# Patient Record
Sex: Female | Born: 1985 | Race: Black or African American | Hispanic: No | Marital: Single | Smoking: Current every day smoker
Health system: Southern US, Community
[De-identification: ages and names within clinical notes are randomized; demographics above are authoritative.]

## PROBLEM LIST (undated history)

## (undated) ENCOUNTER — Inpatient Hospital Stay (HOSPITAL_COMMUNITY): Payer: Self-pay

## (undated) DIAGNOSIS — K219 Gastro-esophageal reflux disease without esophagitis: Secondary | ICD-10-CM

## (undated) DIAGNOSIS — B019 Varicella without complication: Secondary | ICD-10-CM

## (undated) DIAGNOSIS — J45909 Unspecified asthma, uncomplicated: Secondary | ICD-10-CM

## (undated) DIAGNOSIS — F419 Anxiety disorder, unspecified: Secondary | ICD-10-CM

## (undated) DIAGNOSIS — L509 Urticaria, unspecified: Secondary | ICD-10-CM

## (undated) DIAGNOSIS — W3301XA Accidental discharge of shotgun, initial encounter: Secondary | ICD-10-CM

## (undated) DIAGNOSIS — G43909 Migraine, unspecified, not intractable, without status migrainosus: Secondary | ICD-10-CM

## (undated) DIAGNOSIS — L309 Dermatitis, unspecified: Secondary | ICD-10-CM

## (undated) DIAGNOSIS — R87619 Unspecified abnormal cytological findings in specimens from cervix uteri: Secondary | ICD-10-CM

## (undated) HISTORY — DX: Varicella without complication: B01.9

## (undated) HISTORY — DX: Urticaria, unspecified: L50.9

## (undated) HISTORY — DX: Dermatitis, unspecified: L30.9

## (undated) HISTORY — PX: COLPOSCOPY: SHX161

## (undated) HISTORY — DX: Unspecified abnormal cytological findings in specimens from cervix uteri: R87.619

## (undated) HISTORY — DX: Unspecified asthma, uncomplicated: J45.909

## (undated) HISTORY — DX: Migraine, unspecified, not intractable, without status migrainosus: G43.909

---
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
## 2013-06-11 ENCOUNTER — Encounter (HOSPITAL_COMMUNITY): Payer: Self-pay | Admitting: *Deleted

## 2013-06-11 ENCOUNTER — Inpatient Hospital Stay (HOSPITAL_COMMUNITY)
Admission: AD | Admit: 2013-06-11 | Discharge: 2013-06-11 | Disposition: A | Discharge status: Home / Self Care | Payer: 59 | Source: Ambulatory Visit | Attending: Obstetrics & Gynecology | Admitting: Obstetrics & Gynecology

## 2013-06-11 ENCOUNTER — Inpatient Hospital Stay (HOSPITAL_COMMUNITY): Payer: 59

## 2013-06-11 DIAGNOSIS — O2390 Unspecified genitourinary tract infection in pregnancy, unspecified trimester: Secondary | ICD-10-CM | POA: Insufficient documentation

## 2013-06-11 DIAGNOSIS — N760 Acute vaginitis: Secondary | ICD-10-CM | POA: Insufficient documentation

## 2013-06-11 DIAGNOSIS — A499 Bacterial infection, unspecified: Secondary | ICD-10-CM | POA: Insufficient documentation

## 2013-06-11 DIAGNOSIS — K219 Gastro-esophageal reflux disease without esophagitis: Secondary | ICD-10-CM | POA: Insufficient documentation

## 2013-06-11 DIAGNOSIS — O99330 Smoking (tobacco) complicating pregnancy, unspecified trimester: Secondary | ICD-10-CM | POA: Insufficient documentation

## 2013-06-11 DIAGNOSIS — F411 Generalized anxiety disorder: Secondary | ICD-10-CM | POA: Insufficient documentation

## 2013-06-11 DIAGNOSIS — B9689 Other specified bacterial agents as the cause of diseases classified elsewhere: Secondary | ICD-10-CM | POA: Insufficient documentation

## 2013-06-11 DIAGNOSIS — O209 Hemorrhage in early pregnancy, unspecified: Secondary | ICD-10-CM | POA: Insufficient documentation

## 2013-06-11 DIAGNOSIS — O009 Unspecified ectopic pregnancy without intrauterine pregnancy: Secondary | ICD-10-CM

## 2013-06-11 DIAGNOSIS — R109 Unspecified abdominal pain: Secondary | ICD-10-CM | POA: Insufficient documentation

## 2013-06-11 HISTORY — DX: Anxiety disorder, unspecified: F41.9

## 2013-06-11 HISTORY — DX: Gastro-esophageal reflux disease without esophagitis: K21.9

## 2013-06-11 LAB — URINALYSIS, ROUTINE W REFLEX MICROSCOPIC
BILIRUBIN URINE: NEGATIVE
Glucose, UA: NEGATIVE mg/dL
KETONES UR: NEGATIVE mg/dL
Leukocytes, UA: NEGATIVE
NITRITE: NEGATIVE
Protein, ur: NEGATIVE mg/dL
Specific Gravity, Urine: 1.025 (ref 1.005–1.030)
UROBILINOGEN UA: 0.2 mg/dL (ref 0.0–1.0)
pH: 5.5 (ref 5.0–8.0)

## 2013-06-11 LAB — CBC
HCT: 35 % — ABNORMAL LOW (ref 36.0–46.0)
Hemoglobin: 11.8 g/dL — ABNORMAL LOW (ref 12.0–15.0)
MCH: 30.1 pg (ref 26.0–34.0)
MCHC: 33.7 g/dL (ref 30.0–36.0)
MCV: 89.3 fL (ref 78.0–100.0)
Platelets: 240 10*3/uL (ref 150–400)
RBC: 3.92 MIL/uL (ref 3.87–5.11)
RDW: 14.3 % (ref 11.5–15.5)
WBC: 7.9 10*3/uL (ref 4.0–10.5)

## 2013-06-11 LAB — HIV ANTIBODY (ROUTINE TESTING W REFLEX): HIV: NONREACTIVE

## 2013-06-11 LAB — URINE MICROSCOPIC-ADD ON

## 2013-06-11 LAB — WET PREP, GENITAL
Trich, Wet Prep: NONE SEEN
Yeast Wet Prep HPF POC: NONE SEEN

## 2013-06-11 LAB — ABO/RH: ABO/RH(D): O POS

## 2013-06-11 LAB — HCG, QUANTITATIVE, PREGNANCY: HCG, BETA CHAIN, QUANT, S: 323 m[IU]/mL — AB (ref <5)

## 2013-06-11 MED ORDER — PROMETHAZINE HCL 25 MG PO TABS: 25.0000 mg | ORAL_TABLET | Freq: Four times a day (QID) | ORAL | Status: DC | PRN

## 2013-06-11 MED ORDER — ACETAMINOPHEN 325 MG PO TABS: 650.0000 mg | ORAL_TABLET | Freq: Once | ORAL | Status: DC

## 2013-06-11 MED ORDER — ACETAMINOPHEN-CODEINE #3 300-30 MG PO TABS: 1.0000 | ORAL_TABLET | Freq: Four times a day (QID) | ORAL | Status: DC | PRN

## 2013-06-11 MED ORDER — METRONIDAZOLE 500 MG PO TABS: 500.0000 mg | ORAL_TABLET | Freq: Two times a day (BID) | ORAL | Status: DC

## 2013-06-11 NOTE — MAU Provider Note (Signed)
History     CSN: [id number]  Arrival date and time: 06/11/13 0848   None     Chief Complaint  Patient presents with  . Vaginal Bleeding   Vaginal Bleeding Associated symptoms include abdominal pain and hematuria. Pertinent negatives include no chills, constipation, diarrhea, dysuria, fever, flank pain, frequency, headaches, joint pain, nausea, sore throat, urgency or vomiting.    Anna Webb is a 28 y.o. G1P0, [redacted]w[redacted]d by LMP presenting for persistent bleeding since beginning of April. Patient states that she had unprotected sex before moving to [place] on the 28th. In mid March, the patient was having abdominal pain with spotting of blood. Anna Webb then reports having 2 +HPT. She subsequently went to the ED in [facility] where they confirmed her pregnancy by UPT and Beta HCG. Per the patient her bHCG was 837.  In early April, the patient began spotting and feeling intermittent sharp lower abdominal pain, more on the right than the left, rated 10/10. The pain has since improved to 7/10 and is relieved when the patient urinates or defecates. Her bleeding has also been heavier in the last 10 days, stating that instead of spotting it is more like her menstrual cycle and has had small blood clots as well. She has been changing her pad twice a day but not necessarily soaking them. She denies vaginal discharge or pain. ROS is as below. Of note, the patient admits to smoking marijuana daily and cigarettes <1/2 ppd.    Past Medical History  Diagnosis Date  . Anxiety   . GERD (gastroesophageal reflux disease)     History reviewed. No pertinent past surgical history.  History reviewed. No pertinent family history.  History  Substance Use Topics  . Smoking status: Current Every Day Smoker -- 0.25 packs/day    Types: Cigarettes  . Smokeless tobacco: Never Used  . Alcohol Use: Yes     Comment: occasionally    Allergies:  Allergies  Allergen Reactions  . Penicillins Rash    Prescriptions  prior to admission  Medication Sig Dispense Refill  . ALPRAZolam (XANAX) 0.25 MG tablet Take 0.25 mg by mouth at bedtime as needed for anxiety.        Review of Systems  Constitutional: Negative for fever and chills.  HENT: Negative for hearing loss and sore throat.   Eyes: Negative for blurred vision and double vision.  Respiratory: Negative for shortness of breath.   Cardiovascular: Negative for chest pain and leg swelling.  Gastrointestinal: Positive for heartburn and abdominal pain. Negative for nausea, vomiting, diarrhea and constipation.  Genitourinary: Positive for hematuria and vaginal bleeding. Negative for dysuria, urgency, frequency and flank pain.  Musculoskeletal: Negative for joint pain and myalgias.  Neurological: Negative for dizziness and headaches.   Physical Exam   Blood pressure 132/77, pulse 103, temperature 98.3 F (36.8 C), temperature source Oral, resp. rate 18, height 5' 3.5" (1.613 m), weight 64.864 kg (143 lb), last menstrual period 04/05/2013.  Physical Exam  Constitutional: She is oriented to person, place, and time. She appears well-developed and well-nourished. No distress.  HENT:  Head: Normocephalic and atraumatic.  Eyes: EOM are normal. Pupils are equal, round, and reactive to light. No scleral icterus.  Neck: Normal range of motion. No tracheal deviation present.  Cardiovascular: Normal rate, regular rhythm, normal heart sounds and intact distal pulses.  Exam reveals no gallop and no friction rub.   No murmur heard. Respiratory: Effort normal and breath sounds normal. No stridor. No respiratory distress. She  has no wheezes.  GI: Soft. Bowel sounds are normal. She exhibits no distension and no mass. There is tenderness (left lower quadrant/suprapubic tenderness). There is no rebound and no guarding.  Genitourinary:  Genital:external negative Vaginal:small/moderate amount dark red blood Cervix:closed/thick Bimanual:tender   Musculoskeletal: Normal  range of motion. She exhibits no edema and no tenderness.  Neurological: She is alert and oriented to person, place, and time.  Skin: Skin is warm and dry. She is not diaphoretic.  Psychiatric: She has a normal mood and affect.    MAU Course  Procedures  Results for orders placed during the hospital encounter of 06/11/13 (from the past 24 hour(s))  URINALYSIS, ROUTINE W REFLEX MICROSCOPIC     Status: Abnormal   Collection Time    06/11/13  9:10 AM      Result Value Ref Range   Color, Urine YELLOW  YELLOW   APPearance CLEAR  CLEAR   Specific Gravity, Urine 1.025  1.005 - 1.030   pH 5.5  5.0 - 8.0   Glucose, UA NEGATIVE  NEGATIVE mg/dL   Hgb urine dipstick SMALL (*) NEGATIVE   Bilirubin Urine NEGATIVE  NEGATIVE   Ketones, ur NEGATIVE  NEGATIVE mg/dL   Protein, ur NEGATIVE  NEGATIVE mg/dL   Urobilinogen, UA 0.2  0.0 - 1.0 mg/dL   Nitrite NEGATIVE  NEGATIVE   Leukocytes, UA NEGATIVE  NEGATIVE  URINE MICROSCOPIC-ADD ON     Status: None   Collection Time    06/11/13  9:10 AM      Result Value Ref Range   Squamous Epithelial / LPF RARE  RARE   WBC, UA 0-2  <3 WBC/hpf   RBC / HPF 0-2  <3 RBC/hpf   Urine-Other MUCOUS PRESENT    WET PREP, GENITAL     Status: Abnormal   Collection Time    06/11/13 10:20 AM      Result Value Ref Range   Yeast Wet Prep HPF POC NONE SEEN  NONE SEEN   Trich, Wet Prep NONE SEEN  NONE SEEN   Clue Cells Wet Prep HPF POC MANY (*) NONE SEEN   WBC, Wet Prep HPF POC MODERATE (*) NONE SEEN  CBC     Status: Abnormal   Collection Time    06/11/13 10:30 AM      Result Value Ref Range   WBC 7.9  4.0 - 10.5 K/uL   RBC 3.92  3.87 - 5.11 MIL/uL   Hemoglobin 11.8 (*) 12.0 - 15.0 g/dL   HCT 96.035.0 (*) 45.436.0 - 09.846.0 %   MCV 89.3  78.0 - 100.0 fL   MCH 30.1  26.0 - 34.0 pg   MCHC 33.7  30.0 - 36.0 g/dL   RDW 11.914.3  14.711.5 - 82.915.5 %   Platelets 240  150 - 400 K/uL  ABO/RH     Status: None   Collection Time    06/11/13 10:30 AM      Result Value Ref Range   ABO/RH(D)  O POS    HCG, QUANTITATIVE, PREGNANCY     Status: Abnormal   Collection Time    06/11/13 10:30 AM      Result Value Ref Range   hCG, Beta Chain, Quant, S 323 (*) <5 mIU/mL   [place] Ob Comp Less 14 Wks  06/11/2013   CLINICAL DATA:  Bleeding, pain  EXAM: OBSTETRIC <14 WK US AND TRANSVAGINAL OB US  TECHNIQUE: Both transabdominal and transvaginal ultrasound examinations were performed for complete evaluation of the  gestation as well as the maternal uterus, adnexal regions, and pelvic cul-de-sac. Transvaginal technique was performed to assess early pregnancy.  COMPARISON:  None.  FINDINGS: Intrauterine gestational sac: None visualized  Yolk sac:  None visualize  Embryo:  None visualized  Maternal uterus/adnexae: There is a complex hypoechoic mass in the right adnexa adjacent to the right ovary. This measures 3.6 x 2.5 x 1.9 cm. Findings are concerning for possible ectopic pregnancy. Large amount of free fluid in the pelvis. Left ovary and adnexa unremarkable.  IMPRESSION: No intrauterine pregnancy visualized.  3.6 cm complex hypoechoic mass in the right adnexa adjacent to the right ovary with a large amount of free fluid in the pelvis. Findings concerning for right ectopic pregnancy.  Critical Value/emergent results were called by telephone at the time of interpretation on 06/11/2013 at 11:27 AM to Gurney MaxinMike Mani, PA who verbally acknowledged these results.   Electronically Signed   By: Charlett NoseKevin  Dover M.D.   On: 06/11/2013 11:27   [place] Ob Transvaginal  06/11/2013   CLINICAL DATA:  Bleeding, pain  EXAM: OBSTETRIC <14 WK US AND TRANSVAGINAL OB US  TECHNIQUE: Both transabdominal and transvaginal ultrasound examinations were performed for complete evaluation of the gestation as well as the maternal uterus, adnexal regions, and pelvic cul-de-sac. Transvaginal technique was performed to assess early pregnancy.  COMPARISON:  None.  FINDINGS: Intrauterine gestational sac: None visualized  Yolk sac:  None visualize  Embryo:  None  visualized  Maternal uterus/adnexae: There is a complex hypoechoic mass in the right adnexa adjacent to the right ovary. This measures 3.6 x 2.5 x 1.9 cm. Findings are concerning for possible ectopic pregnancy. Large amount of free fluid in the pelvis. Left ovary and adnexa unremarkable.  IMPRESSION: No intrauterine pregnancy visualized.  3.6 cm complex hypoechoic mass in the right adnexa adjacent to the right ovary with a large amount of free fluid in the pelvis. Findings concerning for right ectopic pregnancy.  Critical Value/emergent results were called by telephone at the time of interpretation on 06/11/2013 at 11:27 AM to Gurney MaxinMike Mani, PA who verbally acknowledged these results.   Electronically Signed   By: Charlett NoseKevin  Dover M.D.   On: 06/11/2013 11:27     Assessment and Plan   Anna Webb is a 28 y.o. G1P0, [redacted]w[redacted]d by LMP presenting for persistent bleeding in April. Must rule out ectopic pregnancy, STI, active hemorrhage.   A: Questionable Ectopic Pregnancy verses SAB Bacterial vaginosis   Plan: - CBC, US transvaginal, bHCG, ABORh - GC Chlamydia, Wet prep Tylenol for pain now Flagyl 500 mg po BID x 7 days Tylenol #3 po for pain Phenergan 25 mg po q 6 hrs prn Dr Debroah LoopArnold called with U/S results and will see patient. He advised repeat Quant in 48 hours [facility] reviewing the ultrasound himself Appointment at Clinic Monday April 20th at 1 pm Advised if sx worsen return to MAU Note for work Monday   Anna BambergMario Webb 06/11/2013, 9:47 AM

## 2013-06-11 NOTE — Discharge Instructions (Signed)
Ectopic Pregnancy °An ectopic pregnancy is when the fertilized egg attaches (implants) outside the uterus. Most ectopic pregnancies occur in the fallopian tube. Rarely do ectopic pregnancies occur on the ovary, intestine, pelvis, or cervix. In an ectopic pregnancy, the fertilized egg does not have the ability to develop into a normal, healthy baby.  °A ruptured ectopic pregnancy is one in which the fallopian tube gets torn or bursts and results in internal bleeding. Often there is intense abdominal pain, and sometimes, vaginal bleeding. Having an ectopic pregnancy can be life threatening. If left untreated, this dangerous condition can lead to a blood transfusion, abdominal surgery, or even death. °CAUSES  °Damage to the fallopian tubes is the suspected cause in most ectopic pregnancies.  °RISK FACTORS °Depending on your circumstances, the risk of having an ectopic pregnancy will vary. The level of risk can be divided into three categories. °High Risk °· You have gone through infertility treatment. °· You have had a previous ectopic pregnancy. °· You have had previous tubal surgery. °· You have had previous surgery to have the fallopian tubes tied (tubal ligation). °· You have tubal problems or diseases. °· You have been exposed to DES. DES is a medicine that was used until 1971 and had effects on babies whose mothers took the medicine. °· You become pregnant while using an intrauterine device (IUD) for birth control.  °Moderate Risk °· You have a history of infertility. °· You have a history of a sexually transmitted infection (STI). °· You have a history of pelvic inflammatory disease (PID). °· You have scarring from endometriosis. °· You have multiple sexual partners. °· You smoke.  °Low Risk °· You have had previous pelvic surgery. °· You use vaginal douching. °· You became sexually active before 27 years of age. °SIGNS AND SYMPTOMS  °An ectopic pregnancy should be suspected in anyone who has missed a period and  has abdominal pain or bleeding. °· You may experience normal pregnancy symptoms, such as: °· Nausea. °· Tiredness. °· Breast tenderness. °· Other symptoms may include: °· Pain with intercourse. °· Irregular vaginal bleeding or spotting. °· Cramping or pain on one side or in the lower abdomen. °· Fast heartbeat. °· Passing out while having a bowel movement. °· Symptoms of a ruptured ectopic pregnancy and internal bleeding may include: °· Sudden, severe pain in the abdomen and pelvis. °· Dizziness or fainting. °· Pain in the shoulder area. °DIAGNOSIS  °Tests that may be performed include: °· A pregnancy test. °· An ultrasound test. °· Testing the specific level of pregnancy hormone in the bloodstream. °· Taking a sample of uterus tissue (dilation and curettage, D&C). °· Surgery to perform a visual exam of the inside of the abdomen using a thin, lighted tube with a tiny camera on the end (laparoscope). °TREATMENT  °An injection of a medicine called methotrexate may be given. This medicine causes the pregnancy tissue to be absorbed. It is given if: °· The diagnosis is made early. °· The fallopian tube has not ruptured. °· You are considered to be a good candidate for the medicine. °Usually, pregnancy hormone blood levels are checked after methotrexate treatment. This is to be sure the medicine is effective. It may take 4 6 weeks for the pregnancy to be absorbed (though most pregnancies will be absorbed by 3 weeks). °Surgical treatment may be needed. A laparoscope may be used to remove the pregnancy tissue. If severe internal bleeding occurs, a cut (incision) may be made in the lower abdomen (laparotomy), and the   ectopic pregnancy is removed. This stops the bleeding. Part of the fallopian tube, or the whole tube, may be removed as well (salpingectomy). After surgery, pregnancy hormone tests may be done to be sure there is no pregnancy tissue left. You may receive an Rho(D) immune globulin shot if you are Rh negative and  the father is Rh positive, or if you do not know the Rh type of the father. This is to prevent problems with any future pregnancy. SEEK IMMEDIATE MEDICAL CARE IF:  You have any symptoms of an ectopic pregnancy. This is a medical emergency. Document Released: 03/23/2004 Document Revised: 12/04/2012 Document Reviewed: 09/12/2012 [facility] Patient Information 2014 [place], [place]. Threatened Miscarriage Bleeding during the first 20 weeks of pregnancy is common. This is sometimes called a threatened miscarriage. This is a pregnancy that is threatening to end before the twentieth week of pregnancy. Often this bleeding stops with bed rest or decreased activities as suggested by your caregiver and the pregnancy continues without any more problems. You may be asked to not have sexual intercourse, have orgasms or use tampons until further notice. Sometimes a threatened miscarriage can progress to a complete or incomplete miscarriage. This may or may not require further treatment. Some miscarriages occur before a woman misses a menstrual period and knows she is pregnant. Miscarriages occur in 15 to 20% of all pregnancies and usually occur during the first 13 weeks of the pregnancy. The exact cause of a miscarriage is usually never known. A miscarriage is natures way of ending a pregnancy that is abnormal or would not make it to term. There are some things that may put you at risk to have a miscarriage, such as:  Hormone problems.  Infection of the uterus or cervix.  Chronic illness, diabetes for example, especially if it is not controlled.  Abnormal shaped uterus.  Fibroids in the uterus.  Incompetent cervix (the cervix is too weak to hold the baby).  Smoking.  Drinking too much alcohol. It's best not to drink any alcohol when you are pregnant.  Taking illegal drugs. TREATMENT  When a miscarriage becomes complete and all products of conception (all the tissue in the uterus) have been passed, often no  treatment is needed. If you think you passed tissue, save it in a container and take it to your doctor for evaluation. If the miscarriage is incomplete (parts of the fetus or placenta remain in the uterus), further treatment may be needed. The most common reason for further treatment is continued bleeding (hemorrhage) because pregnancy tissue did not pass out of the uterus. This often occurs if a miscarriage is incomplete. Tissue left behind may also become infected. Treatment usually is dilatation and curettage (the removal of the remaining products of pregnancy. This can be done by a simple sucking procedure (suction curettage) or a simple scraping of the inside of the uterus. This may be done in the hospital or in the caregiver's office. This is only done when your caregiver knows that there is no chance for the pregnancy to proceed to term. This is determined by physical examination, negative pregnancy test, falling pregnancy hormone count and/or, an ultrasound revealing a dead fetus. Miscarriages are often a very emotional time for prospective mothers and fathers. This is not you or your partners fault. It did not occur because of an inadequacy in you or your partner. Nearly all miscarriages occur because the pregnancy has started off wrongly. At least half of these pregnancies have a chromosomal abnormality. It is almost  always not inherited. Others may have developmental problems with the fetus or placenta. This does not always show up even when the products miscarried are studied under the microscope. The miscarriage is nearly always not your fault and it is not likely that you could have prevented it from happening. If you are having emotional and grieving problems, talk to your health care provider and even seek counseling, if necessary, before getting pregnant again. You can begin trying for another pregnancy as soon as your caregiver says it is OK. HOME CARE INSTRUCTIONS   Your caregiver may order  bed rest depending on how much bleeding and cramping you are having. You may be limited to only getting up to go to the bathroom. You may be allowed to continue light activity. You may need to make arrangements for the care of your other children and for any other responsibilities.  Keep track of the number of pads you use each day, how often you have to change pads and how saturated (soaked) they are. Record this information.  DO NOT USE TAMPONS. Do not douche, have sexual intercourse or orgasms until approved by your caregiver.  You may receive a follow up appointment for re-evaluation of your pregnancy and a repeat blood test. Re-evaluation often occurs after 2 days and again in 4 to 6 weeks. It is very important that you follow-up in the recommended time period.  If you are Rh negative and the father is Rh positive or you do not know the fathers' blood type, you may receive a shot (Rh immune globulin) to help prevent abnormal antibodies that can develop and affect the baby in any future pregnancies. SEEK IMMEDIATE MEDICAL CARE IF:  You have severe cramps in your stomach, back, or abdomen.  You have a sudden onset of severe pain in the lower part of your abdomen.  You develop chills.  You run an unexplained temperature of 101 F (38.3 C) or higher.  You pass large clots or tissue. Save any tissue for your caregiver to inspect.  Your bleeding increases or you become light-headed, weak, or have fainting episodes.  You have a gush of fluid from your vagina.  You pass out. This could mean you have a tubal (ectopic) pregnancy. Document Released: 02/13/2005 Document Revised: 05/08/2011 Document Reviewed: 09/30/2007 [facility] Patient Information 2014 [place], [place].

## 2013-06-11 NOTE — MAU Provider Note (Signed)
I reviewed labs and US report and images and examined the patient and I have low suspicion for ruptured ectopic pregnancy but will follow as outpatient with ectopic precautions and f/u beta HCG as described in the note  Adam PhenixJames G Arnold, MD

## 2013-06-11 NOTE — MAU Note (Signed)
Went to [facility] for abd pain at the end of March, found out she was pregnant.  Was having some spotting at that time (HCG-[id number]).  Has not been seen since. On 04/01, Started bleeding more like a period.  Has continued to bleed.

## 2013-06-12 LAB — GC/CHLAMYDIA PROBE AMP
CT Probe RNA: NEGATIVE
GC Probe RNA: NEGATIVE

## 2013-06-13 ENCOUNTER — Encounter (HOSPITAL_COMMUNITY): Payer: Self-pay

## 2013-06-13 ENCOUNTER — Inpatient Hospital Stay (HOSPITAL_COMMUNITY)
Admission: AD | Admit: 2013-06-13 | Discharge: 2013-06-13 | Disposition: A | Discharge status: Home / Self Care | Payer: 59 | Source: Ambulatory Visit | Attending: Family Medicine | Admitting: Family Medicine

## 2013-06-13 DIAGNOSIS — O200 Threatened abortion: Secondary | ICD-10-CM | POA: Insufficient documentation

## 2013-06-13 LAB — HCG, QUANTITATIVE, PREGNANCY: hCG, Beta Chain, Quant, S: 232 m[IU]/mL — ABNORMAL HIGH (ref <5)

## 2013-06-13 NOTE — MAU Note (Signed)
Patient to MAU for follow up BHCG. Patient denies pain but continues to have bleeding like a period.

## 2013-06-13 NOTE — MAU Provider Note (Signed)
Attestation of Attending Supervision of Advanced Practitioner (PA/CNM/NP): Evaluation and management procedures were performed by the Advanced Practitioner under my supervision and collaboration.  I have reviewed the Advanced Practitioner's note and chart, and I agree with the management and plan.  Shericka Johnstone S Chellsea Beckers, MD [facility] Attending 06/13/2013 9:44 PM   

## 2013-06-13 NOTE — MAU Note (Signed)
Sharen CounterLisa Leftwich Kirby CNM discussed lab results and reviewed follow-up instructions with patient.

## 2013-06-13 NOTE — MAU Provider Note (Signed)
S: 28 y.o. G1P0 @[redacted]w[redacted]d  by LMP presents to MAU for repeat quant hcg.  She denies abdominal pain or vaginal bleeding today but reports bright red vaginal bleeding with clots yesterday. She denies vaginal itching/burning, urinary symptoms, h/a, dizziness, n/v, or fever/chills.    Initial imaging with suspicious adnexal mass but reviewed by Dr Oswaldo MilianArnold--low suspicion for ruptured ectopic and plan to repeat labs.    O: BP 116/64  Pulse 71  Temp(Src) 98.4 F (36.9 C) (Oral)  Resp 16  SpO2 100%  LMP 04/05/2013  Results for orders placed during the hospital encounter of 06/13/13 (from the past 168 hour(s))  HCG, QUANTITATIVE, PREGNANCY   Collection Time    06/13/13  5:53 PM      Result Value Ref Range   hCG, Beta Chain, Quant, S 232 (*) <5 mIU/mL  HCG, QUANTITATIVE, PREGNANCY   Collection Time    06/11/13 10:30 AM      Result Value Ref Range   hCG, Beta Chain, Quant, S 323 (*) <5 mIU/mL  ABO/RH   Collection Time    06/11/13 10:30 AM      Result Value Ref Range   ABO/RH(D) O POS       A: Likely SAB with decrease in quant hcg over 48 hours and increase in vaginal bleeding  P: D/C home with bleeding/ectopic precautions Appointment in Gyn clinic for repeat quant hcg in 1 week Return to MAU as needed  Sharen CounterLisa Leftwich-Kirby Certified Nurse-Midwife

## 2013-06-17 ENCOUNTER — Other Ambulatory Visit: Payer: 59

## 2013-06-17 ENCOUNTER — Telehealth: Payer: Self-pay

## 2013-06-17 DIAGNOSIS — E340 Carcinoid syndrome: Secondary | ICD-10-CM

## 2013-06-17 LAB — HCG, QUANTITATIVE, PREGNANCY: HCG, BETA CHAIN, QUANT, S: 194.8 m[IU]/mL

## 2013-06-17 NOTE — Telephone Encounter (Signed)
Message copied by Louanna RawAMPBELL, Catlyn Shipton M on Tue Jun 17, 2013  4:08 PM ------      Message from: Reva BoresPRATT, TANYA S      Created: Tue Jun 17, 2013  3:12 PM       F/u BHCG in 48 hours ------

## 2013-06-17 NOTE — Telephone Encounter (Signed)
Called pt. No answer. Left message stating we are calling with results it is very important that we get in touch with you within the next day, please call clinic.

## 2013-06-18 NOTE — Telephone Encounter (Signed)
Called pt.'s contact number to see if Gala Murdochanisha has a different number for patient. Gala Murdochanisha gave same number we have listed but stated Elease Hashimotolisha "is my cousin and works crazy hours. I will text her right now and let her know you are trying to reach her."

## 2013-06-18 NOTE — Telephone Encounter (Signed)
Pt. Returned call. Informed her of results and need for repeat beta hcg tomorrow. Pt. States she can be here at 1030. Informed pt. I would put her on lab schedule for that time. Pt. Verbalized understanding.

## 2013-06-18 NOTE — Telephone Encounter (Signed)
Attempted to call pt. No answer. Left message stating we are calling with results as well as to schedule an appointment for you; it is important that you follow up with us tomorrow, please call clinic so that you may speak to one of our nurses.

## 2013-06-19 ENCOUNTER — Other Ambulatory Visit: Payer: 59

## 2013-06-19 DIAGNOSIS — E349 Endocrine disorder, unspecified: Secondary | ICD-10-CM

## 2013-06-19 LAB — HCG, QUANTITATIVE, PREGNANCY: HCG, BETA CHAIN, QUANT, S: 128.6 m[IU]/mL

## 2013-06-20 ENCOUNTER — Telehealth: Payer: Self-pay | Admitting: *Deleted

## 2013-06-20 DIAGNOSIS — O039 Complete or unspecified spontaneous abortion without complication: Secondary | ICD-10-CM

## 2013-06-20 NOTE — Telephone Encounter (Signed)
Attempted to contact patient to notify of results and recommendation.  Will send message to ADMIN pool to schedule for lab next Thursday.

## 2013-06-20 NOTE — Telephone Encounter (Signed)
Message copied by Dorothyann PengHAIZLIP, Donnisha Besecker E on Fri Jun 20, 2013 12:01 PM ------      Message from: Adam PhenixARNOLD, JAMES G      Created: Thu Jun 19, 2013  7:20 PM       Beta hcg dropping appropriately, repeat in 1 week ------

## 2013-06-23 NOTE — Telephone Encounter (Signed)
Pt called the front desk and I informed pt that her quant levels are going down as they should and that we need her to come in on Thursday for another beta quant.  Pt stated that she would come in on 06/26/13 around 1130 pm.

## 2013-06-26 ENCOUNTER — Other Ambulatory Visit: Payer: 59

## 2013-12-29 ENCOUNTER — Encounter (HOSPITAL_COMMUNITY): Payer: Self-pay

## 2014-01-09 ENCOUNTER — Emergency Department (HOSPITAL_BASED_OUTPATIENT_CLINIC_OR_DEPARTMENT_OTHER)
Admission: EM | Admit: 2014-01-09 | Discharge: 2014-01-09 | Disposition: A | Discharge status: Home / Self Care | Payer: 59 | Attending: Emergency Medicine | Admitting: Emergency Medicine

## 2014-01-09 ENCOUNTER — Encounter (HOSPITAL_BASED_OUTPATIENT_CLINIC_OR_DEPARTMENT_OTHER): Payer: Self-pay

## 2014-01-09 DIAGNOSIS — J320 Chronic maxillary sinusitis: Secondary | ICD-10-CM | POA: Diagnosis not present

## 2014-01-09 DIAGNOSIS — H53149 Visual discomfort, unspecified: Secondary | ICD-10-CM | POA: Diagnosis not present

## 2014-01-09 DIAGNOSIS — F419 Anxiety disorder, unspecified: Secondary | ICD-10-CM | POA: Insufficient documentation

## 2014-01-09 DIAGNOSIS — M542 Cervicalgia: Secondary | ICD-10-CM | POA: Diagnosis not present

## 2014-01-09 DIAGNOSIS — Z79899 Other long term (current) drug therapy: Secondary | ICD-10-CM | POA: Insufficient documentation

## 2014-01-09 DIAGNOSIS — R519 Headache, unspecified: Secondary | ICD-10-CM

## 2014-01-09 DIAGNOSIS — Z8719 Personal history of other diseases of the digestive system: Secondary | ICD-10-CM | POA: Diagnosis not present

## 2014-01-09 DIAGNOSIS — Z880 Allergy status to penicillin: Secondary | ICD-10-CM | POA: Insufficient documentation

## 2014-01-09 DIAGNOSIS — Z792 Long term (current) use of antibiotics: Secondary | ICD-10-CM | POA: Diagnosis not present

## 2014-01-09 DIAGNOSIS — Z3202 Encounter for pregnancy test, result negative: Secondary | ICD-10-CM | POA: Diagnosis not present

## 2014-01-09 DIAGNOSIS — R110 Nausea: Secondary | ICD-10-CM | POA: Insufficient documentation

## 2014-01-09 DIAGNOSIS — Z720 Tobacco use: Secondary | ICD-10-CM | POA: Insufficient documentation

## 2014-01-09 DIAGNOSIS — R51 Headache: Secondary | ICD-10-CM | POA: Diagnosis present

## 2014-01-09 LAB — PREGNANCY, URINE: Preg Test, Ur: NEGATIVE

## 2014-01-09 MED ORDER — DIPHENHYDRAMINE HCL 50 MG/ML IJ SOLN
25.0000 mg | Freq: Once | INTRAMUSCULAR | Status: AC
  Administered 2014-01-09: 25 mg via INTRAVENOUS
  Filled 2014-01-09: qty 1

## 2014-01-09 MED ORDER — KETOROLAC TROMETHAMINE 30 MG/ML IJ SOLN
30.0000 mg | Freq: Once | INTRAMUSCULAR | Status: AC
  Administered 2014-01-09: 30 mg via INTRAVENOUS
  Filled 2014-01-09: qty 1

## 2014-01-09 MED ORDER — DEXAMETHASONE SODIUM PHOSPHATE 4 MG/ML IJ SOLN
INTRAMUSCULAR | Status: AC
  Administered 2014-01-09: 10 mg via INTRAVENOUS
  Filled 2014-01-09: qty 3

## 2014-01-09 MED ORDER — DEXAMETHASONE SODIUM PHOSPHATE 10 MG/ML IJ SOLN: 10.0000 mg | Freq: Once | INTRAMUSCULAR | Status: DC

## 2014-01-09 MED ORDER — METOCLOPRAMIDE HCL 5 MG/ML IJ SOLN
10.0000 mg | Freq: Once | INTRAMUSCULAR | Status: AC
  Administered 2014-01-09: 10 mg via INTRAVENOUS
  Filled 2014-01-09: qty 2

## 2014-01-09 MED ORDER — SODIUM CHLORIDE 0.9 % IV BOLUS (SEPSIS)
1000.0000 mL | Freq: Once | INTRAVENOUS | Status: AC
  Administered 2014-01-09: 1000 mL via INTRAVENOUS

## 2014-01-09 NOTE — ED Provider Notes (Signed)
CSN: [id number]     Arrival date & time 01/09/14  1610 History   First MD Initiated Contact with Patient 01/09/14 1625     Chief Complaint  Patient presents with  . Headache     (Consider location/radiation/quality/duration/timing/severity/associated sxs/prior Treatment) Patient is a 28 y.o. female presenting with headaches.  Headache Pain location:  Frontal Quality:  Dull (throbbing) Radiates to:  Does not radiate Pain severity now: Severe. Onset quality:  Gradual Duration:  3 days Timing:  Constant Progression:  Worsening Context comment:  Headache started as sinus pressure, progressed to frontal headache. Relieved by:  Nothing Worsened by:  Light and sound Ineffective treatments: NSAIDs, Claritin-D. Associated symptoms: nausea, neck pain, photophobia and sinus pressure   Associated symptoms: no fever     Past Medical History  Diagnosis Date  . Anxiety   . GERD (gastroesophageal reflux disease)    History reviewed. No pertinent past surgical history. No family history on file. History  Substance Use Topics  . Smoking status: Current Every Day Smoker -- 0.25 packs/day    Types: Cigarettes  . Smokeless tobacco: Never Used  . Alcohol Use: Yes     Comment: occasionally   OB History    Gravida Para Term Preterm AB TAB SAB Ectopic Multiple Living   1              Review of Systems  Constitutional: Negative for fever.  HENT: Positive for sinus pressure.   Eyes: Positive for photophobia.  Gastrointestinal: Positive for nausea.  Musculoskeletal: Positive for neck pain.  Neurological: Positive for headaches.  All other systems reviewed and are negative.     Allergies  Penicillins  Home Medications   Prior to Admission medications   Medication Sig Start Date End Date Taking? Authorizing Provider  acetaminophen-codeine (TYLENOL #3) 300-30 MG per tablet Take 1-2 tablets by mouth every 6 (six) hours as needed for moderate pain. 06/11/13   Delbert PhenixLinda M Barefoot, NP   ALPRAZolam Prudy Feeler(XANAX) 0.25 MG tablet Take 0.25 mg by mouth at bedtime as needed for anxiety.    Historical Provider, MD  metroNIDAZOLE (FLAGYL) 500 MG tablet Take 1 tablet (500 mg total) by mouth 2 (two) times daily. 06/11/13   Delbert PhenixLinda M Barefoot, NP  OVER THE COUNTER MEDICATION Take 1 capsule by mouth daily. Takes over the counter Prevacid    Historical Provider, MD  PRESCRIPTION MEDICATION Apply 1 application topically as needed (for eczema). Patient uses compounded cream for eczema, [place]    Historical Provider, MD  promethazine (PHENERGAN) 25 MG tablet Take 1 tablet (25 mg total) by mouth every 6 (six) hours as needed for nausea or vomiting. 06/11/13   Delbert PhenixLinda M Barefoot, NP   BP 126/69 mmHg  Pulse 55  Temp(Src) 98.2 F (36.8 C) (Oral)  Resp 16  Ht 5\' 4"  (1.626 m)  Wt 145 lb (65.772 kg)  BMI 24.88 kg/m2  SpO2 97%  LMP 12/13/2013  Breastfeeding? Unknown Physical Exam  Constitutional: She is oriented to person, place, and time. She appears well-developed and well-nourished. No distress.  HENT:  Head: Normocephalic and atraumatic.  Nose: Right sinus exhibits maxillary sinus tenderness and frontal sinus tenderness. Left sinus exhibits maxillary sinus tenderness and frontal sinus tenderness.  Mouth/Throat: Oropharynx is clear and moist.  Eyes: Conjunctivae are normal. Pupils are equal, round, and reactive to light. No scleral icterus.  Neck: Normal range of motion. Neck supple. Muscular tenderness (Right paracervical) present. No spinous process tenderness present. No rigidity.  Cardiovascular: Normal rate, regular rhythm, normal heart sounds and intact distal pulses.   No murmur heard. Pulmonary/Chest: Effort normal and breath sounds normal. No stridor. No respiratory distress. She has no rales.  Abdominal: Soft. Bowel sounds are normal. She exhibits no distension. There is no tenderness.  Musculoskeletal: Normal range of motion.  Neurological: She is alert and  oriented to person, place, and time. She has normal strength. No cranial nerve deficit or sensory deficit. Coordination and gait normal. GCS eye subscore is 4. GCS verbal subscore is 5. GCS motor subscore is 6.  Skin: Skin is warm and dry. No rash noted.  Psychiatric: She has a normal mood and affect. Her behavior is normal.  Nursing note and vitals reviewed.   ED Course  Procedures (including critical care time) Labs Review Labs Reviewed  PREGNANCY, URINE    Imaging Review No results found.   EKG Interpretation None      MDM   Final diagnoses:  Acute nonintractable headache, unspecified headache type    28 year old female with frontal headache. Started his sinus congestion, progressed to severe headache. Well-appearing, nontoxic. No meningeal signs. Headache gradual in onset. Plan to treat with IV metoclopramide, diphenhydramine, dexamethasone, Toradol, and fluids.  I don't think she needs neuro imaging.   Headache now resolved. Patient appears stable for discharge. Have given her return precautions.  Warnell Foresterrey Pierre Cumpton, MD 01/09/14 [phone]

## 2014-01-09 NOTE — ED Notes (Signed)
Pt reports headache x 3 days.  Pt reports frontal headache that has getting worse since. Reports taken numerous medications with no relief. Complains of neck pain and nausea and dizziness.

## 2014-03-02 ENCOUNTER — Encounter: Payer: Self-pay | Admitting: Family

## 2014-03-02 ENCOUNTER — Ambulatory Visit (INDEPENDENT_AMBULATORY_CARE_PROVIDER_SITE_OTHER): Payer: BLUE CROSS/BLUE SHIELD | Admitting: Family

## 2014-03-02 VITALS — BP 122/84 | HR 73 | Temp 98.0°F | Resp 18 | Ht 64.0 in | Wt 158.6 lb

## 2014-03-02 DIAGNOSIS — F411 Generalized anxiety disorder: Secondary | ICD-10-CM

## 2014-03-02 DIAGNOSIS — G479 Sleep disorder, unspecified: Secondary | ICD-10-CM | POA: Insufficient documentation

## 2014-03-02 DIAGNOSIS — K219 Gastro-esophageal reflux disease without esophagitis: Secondary | ICD-10-CM | POA: Insufficient documentation

## 2014-03-02 MED ORDER — ALPRAZOLAM 0.5 MG PO TABS: 0.5000 mg | ORAL_TABLET | Freq: Every evening | ORAL | Status: DC | PRN

## 2014-03-02 NOTE — Progress Notes (Signed)
   Subjective:    Patient ID: Anna Webb, female    DOB: 12/29/1985, 29 y.o.   MRN: [id number]  Chief Complaint  Patient presents with  . Establish Care    due to moving needs a new provider    HPI:  Anna Webb is a 29 y.o. female who presents today to establish care.   1) GERD - Stable and treated with pantoprazole.   2) Anxiety - Believes that her anxiety is increased espcially being away from home and family and moving to a new place. Has tried zoloft in the past and did not work very well. Has used Xanax in the past which has helped.   3) Sleep - Trying to avoid eating before sleep. Usually tries to sleep around 11 and then wakes up around 3-4am every morning. Has tried using the Fitbit which registers that she is sleeping about 5 hours per night on average. Has tried Zquil which leave her groggy the next day. Xanax has been used to help her sleep. Has not tried over the counter medication.   Allergies  Allergen Reactions  . Penicillins Rash    Current Outpatient Prescriptions  Medication Sig Dispense Refill  . levonorgestrel-ethinyl estradiol (AVIANE,ALESSE,LESSINA) 0.1-20 MG-MCG tablet Take 1 tablet by mouth daily.    . naproxen (NAPROSYN) 500 MG tablet Take 500 mg by mouth 2 (two) times daily with a meal.    . pantoprazole (PROTONIX) 20 MG tablet Take 20 mg by mouth daily.    Marland Kitchen PRESCRIPTION MEDICATION     . ALPRAZolam (XANAX) 0.5 MG tablet Take 1 tablet (0.5 mg total) by mouth at bedtime as needed for anxiety. 30 tablet 0   No current facility-administered medications for this visit.    Past Medical History  Diagnosis Date  . Anxiety   . GERD (gastroesophageal reflux disease)   . Asthma   . Chicken pox   . Migraines   . Eczema     Review of Systems  Constitutional: Negative for fatigue.  Endocrine: Negative for cold intolerance and heat intolerance.  Psychiatric/Behavioral: Positive for sleep disturbance. The patient is nervous/anxious.       Objective:     BP 122/84 mmHg  Pulse 73  Temp(Src) 98 F (36.7 C) (Oral)  Resp 18  Ht  (1.626 m)  Wt 158 lb 9.6 oz (71.94 kg)  BMI 27.21 kg/m2  SpO2 98%  LMP 04/05/2013 Nursing note and vital signs reviewed.  Physical Exam  Constitutional: She is oriented to person, place, and time. She appears well-developed and well-nourished. No distress.  Cardiovascular: Normal rate, regular rhythm, normal heart sounds and intact distal pulses.   Pulmonary/Chest: Effort normal and breath sounds normal.  Neurological: She is alert and oriented to person, place, and time.  Skin: Skin is warm and dry.  Psychiatric: She has a normal mood and affect. Her behavior is normal. Judgment and thought content normal.       Assessment & Plan:

## 2014-03-02 NOTE — Assessment & Plan Note (Signed)
Indicates she has increased levels of anxiety secondary to being in a new place and being away from family. Discussed starting a daily medication and patient will research at this time. Previously maintained on Xanax, will use for sleep only at this time.

## 2014-03-02 NOTE — Patient Instructions (Addendum)
Thank you for choosing [facility].  Summary/Instructions:  Please explore Cymbalta (duloxetine), Effexor (velafexine), Wellbutrin (buprionon)  Please continue to take your medication.   Please schedule a time for your physical at your convenience.

## 2014-03-02 NOTE — Progress Notes (Signed)
Pre visit review using our clinic review tool, if applicable. No additional management support is needed unless otherwise documented below in the visit note. 

## 2014-03-02 NOTE — Assessment & Plan Note (Signed)
Stable and maintained on pantoprazole. Continue current medication and dosage.

## 2014-03-02 NOTE — Assessment & Plan Note (Signed)
Indicates that her sleep is disrupted by a racing mind which increases her anxiety and decreases her ability to sleep. Continue Xanax 0.5 mg as needed for sleep. Discussed healthy sleep hygiene and suggested trials of melatonin. Follow up in a month or sooner if needed.

## 2014-03-03 ENCOUNTER — Telehealth: Payer: Self-pay | Admitting: Family

## 2014-03-03 NOTE — Telephone Encounter (Signed)
emmi mailed  °

## 2014-03-11 ENCOUNTER — Telehealth: Payer: Self-pay | Admitting: *Deleted

## 2014-03-11 NOTE — Telephone Encounter (Signed)
[facility] - Client TELEPHONE ADVICE RECORD [facility] Patient Name: Anna Webb Gender: Female DOB: Dec 10, 1985 Age: [age] Y 8 M 20 D Return Phone Number: [phone] (Primary), [phone] (Secondary) Address: [address] City[place]State[place]Zip: [place] [address] Client [facility] - Client Client Site [facility] Day Physician Marcos Ekealone, Greg Contact Type Call Call Type Triage / Clinical Relationship To Patient Self Appointment Disposition EMR Appointment Not Necessary Return Phone Number [phone] (Primary) Chief Complaint CHEST PAIN (>=28 years) - pain, pressure, heaviness or tightness Initial Comment Caller states has a cough, phlegm, chest pain and pain in rib cage area and its hard to breath, shortness of breath. PreDisposition InappropriateToAsk Nurse Assessment Nurse: Chrys RacerKoenig, RN, Alexia FreestoneAnna Marie Date/Time Lamount Cohen(Eastern Time): 03/11/2014 9:02:06 AM Confirm and document reason for call. If symptomatic, describe symptoms. ---Caller states has a cough, phlegm, chest pain and pain in rib cage area and its hard to breath, shortness of breath. Has the patient traveled out of the country within the last 30 days? ---No Does the patient require triage? ---Yes Related visit to physician within the last 2 weeks? ---Yes Does the PT have any chronic conditions? (i.e. diabetes, asthma, etc.) ---Yes List chronic conditions. ---asthma Did the patient indicate they were pregnant? ---No Guidelines Guideline Title Affirmed Question Affirmed Notes Nurse Date/Time (Eastern Time) Common Cold Severe difficulty breathing (e.g., struggling for each breath, speaks in single words) Chrys RacerKoenig, RN, Alexia Freestonenna Marie 03/11/2014 9:07:58 AM Disp. Time Lamount Cohen(Eastern Time) Disposition Final User 03/11/2014 8:58:23 AM Send to Urgent Queue Christain SacramentoCervera, Maria 03/11/2014 9:10:05 AM Call EMS 911 Now Yes Chrys RacerKoenig, RN, Alexia FreestoneAnna Marie PLEASE NOTE: All timestamps  contained within this report are represented as [place] Standard Time. CONFIDENTIALTY NOTICE: This fax transmission is intended only for the addressee. It contains information that is legally privileged, confidential or otherwise protected from use or disclosure. If you are not the intended recipient, you are strictly prohibited from reviewing, disclosing, copying using or disseminating any of this information or taking any action in reliance on or regarding this information. If you have received this fax in error, please notify us immediately by telephone so that we can arrange for its return to us. Phone: [phone], Toll-Free: [phone], Fax: [phone] Page: 2 of 2 Call Id: [id number] Caller Understands: Yes Disagree/Comply: Comply Care Advice Given Per Guideline CALL EMS 911 NOW: Immediate medical attention is needed. You need to hang up and call 911 (or an ambulance). ([facility] Discretion: I'll call you back in a few minutes to be sure you were able to reach them.) CARE ADVICE given per [facility] (Adult) guideline. After Care Instructions Given Call Event Type User Date / Time Description Comments User: Milana ObeyAnna Marie, Koenig, RN Date/Time Lamount Cohen(Eastern Time): 03/11/2014 9:11:23 AM pt states her girlfriend will drive her due to monetary issues. She wants to drive there vs. any other transport due to the same.

## 2014-04-10 ENCOUNTER — Other Ambulatory Visit: Payer: Self-pay | Admitting: Family

## 2014-04-27 NOTE — Telephone Encounter (Signed)
Pt called in for refill on her ALPRAZolam (XANAX) 0.5 MG tablet [[id number]

## 2014-04-28 NOTE — Telephone Encounter (Signed)
Pt informed that medication was sent to pharmacy.

## 2014-04-28 NOTE — Telephone Encounter (Signed)
Pt informed

## 2014-05-10 ENCOUNTER — Encounter (HOSPITAL_BASED_OUTPATIENT_CLINIC_OR_DEPARTMENT_OTHER): Payer: Self-pay | Admitting: *Deleted

## 2014-05-10 ENCOUNTER — Emergency Department (HOSPITAL_BASED_OUTPATIENT_CLINIC_OR_DEPARTMENT_OTHER): Payer: BLUE CROSS/BLUE SHIELD

## 2014-05-10 ENCOUNTER — Inpatient Hospital Stay (HOSPITAL_BASED_OUTPATIENT_CLINIC_OR_DEPARTMENT_OTHER)
Admission: EM | Admit: 2014-05-10 | Discharge: 2014-05-11 | DRG: 202 | Disposition: A | Discharge status: Home / Self Care | Payer: BLUE CROSS/BLUE SHIELD | Attending: Internal Medicine | Admitting: Internal Medicine

## 2014-05-10 DIAGNOSIS — J45901 Unspecified asthma with (acute) exacerbation: Principal | ICD-10-CM | POA: Diagnosis present

## 2014-05-10 DIAGNOSIS — K219 Gastro-esophageal reflux disease without esophagitis: Secondary | ICD-10-CM | POA: Diagnosis present

## 2014-05-10 DIAGNOSIS — J9601 Acute respiratory failure with hypoxia: Secondary | ICD-10-CM | POA: Diagnosis present

## 2014-05-10 DIAGNOSIS — E876 Hypokalemia: Secondary | ICD-10-CM | POA: Diagnosis present

## 2014-05-10 DIAGNOSIS — R710 Precipitous drop in hematocrit: Secondary | ICD-10-CM | POA: Diagnosis present

## 2014-05-10 DIAGNOSIS — Z880 Allergy status to penicillin: Secondary | ICD-10-CM | POA: Diagnosis not present

## 2014-05-10 DIAGNOSIS — F411 Generalized anxiety disorder: Secondary | ICD-10-CM | POA: Diagnosis present

## 2014-05-10 DIAGNOSIS — Z716 Tobacco abuse counseling: Secondary | ICD-10-CM | POA: Diagnosis present

## 2014-05-10 DIAGNOSIS — G43909 Migraine, unspecified, not intractable, without status migrainosus: Secondary | ICD-10-CM | POA: Diagnosis present

## 2014-05-10 DIAGNOSIS — F17210 Nicotine dependence, cigarettes, uncomplicated: Secondary | ICD-10-CM | POA: Diagnosis present

## 2014-05-10 DIAGNOSIS — J9600 Acute respiratory failure, unspecified whether with hypoxia or hypercapnia: Secondary | ICD-10-CM | POA: Diagnosis present

## 2014-05-10 DIAGNOSIS — R0602 Shortness of breath: Secondary | ICD-10-CM

## 2014-05-10 LAB — RETICULOCYTES
RBC.: 4.24 MIL/uL (ref 3.87–5.11)
RETIC COUNT ABSOLUTE: 38.2 10*3/uL (ref 19.0–186.0)
Retic Ct Pct: 0.9 % (ref 0.4–3.1)

## 2014-05-10 LAB — CBC WITH DIFFERENTIAL/PLATELET
BASOS ABS: 0 10*3/uL (ref 0.0–0.1)
Basophils Relative: 0 % (ref 0–1)
EOS PCT: 0 % (ref 0–5)
Eosinophils Absolute: 0 10*3/uL (ref 0.0–0.7)
HCT: 42.3 % (ref 36.0–46.0)
HEMOGLOBIN: 14.5 g/dL (ref 12.0–15.0)
LYMPHS ABS: 1.1 10*3/uL (ref 0.7–4.0)
Lymphocytes Relative: 9 % — ABNORMAL LOW (ref 12–46)
MCH: 29.5 pg (ref 26.0–34.0)
MCHC: 34.3 g/dL (ref 30.0–36.0)
MCV: 86 fL (ref 78.0–100.0)
MONOS PCT: 7 % (ref 3–12)
Monocytes Absolute: 0.9 10*3/uL (ref 0.1–1.0)
Neutro Abs: 10.3 10*3/uL — ABNORMAL HIGH (ref 1.7–7.7)
Neutrophils Relative %: 84 % — ABNORMAL HIGH (ref 43–77)
Platelets: 227 10*3/uL (ref 150–400)
RBC: 4.92 MIL/uL (ref 3.87–5.11)
RDW: 12.6 % (ref 11.5–15.5)
WBC: 12.3 10*3/uL — ABNORMAL HIGH (ref 4.0–10.5)

## 2014-05-10 LAB — BLOOD GAS, ARTERIAL
ACID-BASE DEFICIT: 11.2 mmol/L — AB (ref 0.0–2.0)
Bicarbonate: 13.6 mEq/L — ABNORMAL LOW (ref 20.0–24.0)
DRAWN BY: 39899
O2 Content: 2 L/min
O2 Saturation: 95.6 %
Patient temperature: 98.6
TCO2: 14.4 mmol/L (ref 0–100)
pCO2 arterial: 26.8 mmHg — ABNORMAL LOW (ref 35.0–45.0)
pH, Arterial: 7.327 — ABNORMAL LOW (ref 7.350–7.450)
pO2, Arterial: 88.1 mmHg (ref 80.0–100.0)

## 2014-05-10 LAB — URINALYSIS, ROUTINE W REFLEX MICROSCOPIC
BILIRUBIN URINE: NEGATIVE
KETONES UR: NEGATIVE mg/dL
LEUKOCYTES UA: NEGATIVE
Nitrite: NEGATIVE
PROTEIN: NEGATIVE mg/dL
Specific Gravity, Urine: 1.03 (ref 1.005–1.030)
Urobilinogen, UA: 0.2 mg/dL (ref 0.0–1.0)
pH: 5 (ref 5.0–8.0)

## 2014-05-10 LAB — TYPE AND SCREEN
ABO/RH(D): O POS
Antibody Screen: NEGATIVE

## 2014-05-10 LAB — BASIC METABOLIC PANEL
ANION GAP: 12 (ref 5–15)
BUN: 8 mg/dL (ref 6–23)
CHLORIDE: 104 mmol/L (ref 96–112)
CO2: 22 mmol/L (ref 19–32)
CREATININE: 0.77 mg/dL (ref 0.50–1.10)
Calcium: 9.1 mg/dL (ref 8.4–10.5)
GFR calc Af Amer: >90 mL/min (ref >90)
GLUCOSE: 124 mg/dL — AB (ref 70–99)
POTASSIUM: 3.2 mmol/L — AB (ref 3.5–5.1)
Sodium: 138 mmol/L (ref 135–145)

## 2014-05-10 LAB — PREGNANCY, URINE: Preg Test, Ur: NEGATIVE

## 2014-05-10 LAB — URINE MICROSCOPIC-ADD ON

## 2014-05-10 MED ORDER — ONDANSETRON HCL 4 MG PO TABS: 4.0000 mg | ORAL_TABLET | Freq: Four times a day (QID) | ORAL | Status: DC | PRN

## 2014-05-10 MED ORDER — SODIUM CHLORIDE 0.9 % IV SOLN
INTRAVENOUS | Status: DC
  Administered 2014-05-10: 1000 mL via INTRAVENOUS

## 2014-05-10 MED ORDER — ONDANSETRON HCL 4 MG/2ML IJ SOLN
INTRAMUSCULAR | Status: AC
  Filled 2014-05-10: qty 2

## 2014-05-10 MED ORDER — MORPHINE SULFATE 4 MG/ML IJ SOLN
4.0000 mg | INTRAMUSCULAR | Status: DC | PRN
  Administered 2014-05-10: 4 mg via INTRAVENOUS
  Filled 2014-05-10 (×2): qty 1

## 2014-05-10 MED ORDER — ACETAMINOPHEN 500 MG PO TABS
500.0000 mg | ORAL_TABLET | Freq: Four times a day (QID) | ORAL | Status: DC | PRN
  Administered 2014-05-11: 500 mg via ORAL
  Filled 2014-05-10: qty 1

## 2014-05-10 MED ORDER — AZITHROMYCIN 500 MG PO TABS
500.0000 mg | ORAL_TABLET | Freq: Every day | ORAL | Status: DC
  Administered 2014-05-10 – 2014-05-11 (×2): 500 mg via ORAL
  Filled 2014-05-10 (×2): qty 1

## 2014-05-10 MED ORDER — ONDANSETRON HCL 4 MG/2ML IJ SOLN: 4.0000 mg | Freq: Four times a day (QID) | INTRAMUSCULAR | Status: DC | PRN

## 2014-05-10 MED ORDER — POLYETHYLENE GLYCOL 3350 17 G PO PACK
17.0000 g | PACK | Freq: Every day | ORAL | Status: DC | PRN
  Filled 2014-05-10: qty 1

## 2014-05-10 MED ORDER — MORPHINE SULFATE 4 MG/ML IJ SOLN
4.0000 mg | INTRAMUSCULAR | Status: DC | PRN
  Administered 2014-05-10 (×2): 4 mg via INTRAVENOUS
  Filled 2014-05-10: qty 1

## 2014-05-10 MED ORDER — HYDROCODONE-ACETAMINOPHEN 5-325 MG PO TABS
1.0000 | ORAL_TABLET | ORAL | Status: DC | PRN
  Administered 2014-05-11 (×2): 2 via ORAL
  Filled 2014-05-10 (×2): qty 2

## 2014-05-10 MED ORDER — ALBUTEROL (5 MG/ML) CONTINUOUS INHALATION SOLN
10.0000 mg/h | INHALATION_SOLUTION | Freq: Once | RESPIRATORY_TRACT | Status: AC
  Administered 2014-05-10: 10 mg/h via RESPIRATORY_TRACT
  Filled 2014-05-10: qty 20

## 2014-05-10 MED ORDER — ALBUTEROL SULFATE (2.5 MG/3ML) 0.083% IN NEBU
INHALATION_SOLUTION | RESPIRATORY_TRACT | Status: AC
  Administered 2014-05-10: 5 mg
  Filled 2014-05-10: qty 6

## 2014-05-10 MED ORDER — ALUM & MAG HYDROXIDE-SIMETH 200-200-20 MG/5ML PO SUSP
30.0000 mL | Freq: Four times a day (QID) | ORAL | Status: DC | PRN
  Filled 2014-05-10: qty 30

## 2014-05-10 MED ORDER — POTASSIUM CHLORIDE CRYS ER 20 MEQ PO TBCR
40.0000 meq | EXTENDED_RELEASE_TABLET | ORAL | Status: AC
  Administered 2014-05-10 – 2014-05-11 (×2): 40 meq via ORAL
  Filled 2014-05-10 (×2): qty 2

## 2014-05-10 MED ORDER — SODIUM CHLORIDE 0.9 % IJ SOLN
3.0000 mL | Freq: Two times a day (BID) | INTRAMUSCULAR | Status: DC
  Administered 2014-05-10 – 2014-05-11 (×2): 3 mL via INTRAVENOUS

## 2014-05-10 MED ORDER — SODIUM CHLORIDE 0.9 % IV BOLUS (SEPSIS)
1000.0000 mL | Freq: Once | INTRAVENOUS | Status: AC
  Administered 2014-05-10: 1000 mL via INTRAVENOUS

## 2014-05-10 MED ORDER — MONTELUKAST SODIUM 4 MG PO CHEW
4.0000 mg | CHEWABLE_TABLET | Freq: Every day | ORAL | Status: DC
  Administered 2014-05-10: 4 mg via ORAL
  Filled 2014-05-10 (×2): qty 1

## 2014-05-10 MED ORDER — ONDANSETRON HCL 4 MG/2ML IJ SOLN
4.0000 mg | Freq: Once | INTRAMUSCULAR | Status: AC
  Administered 2014-05-10: 4 mg via INTRAVENOUS

## 2014-05-10 MED ORDER — PANTOPRAZOLE SODIUM 40 MG IV SOLR
40.0000 mg | Freq: Two times a day (BID) | INTRAVENOUS | Status: DC
  Administered 2014-05-10: 40 mg via INTRAVENOUS
  Filled 2014-05-10 (×3): qty 40

## 2014-05-10 MED ORDER — METHYLPREDNISOLONE SODIUM SUCC 125 MG IJ SOLR
125.0000 mg | Freq: Once | INTRAMUSCULAR | Status: AC
  Administered 2014-05-10: 125 mg via INTRAVENOUS
  Filled 2014-05-10: qty 2

## 2014-05-10 MED ORDER — ALPRAZOLAM 0.5 MG PO TABS: 0.5000 mg | ORAL_TABLET | Freq: Three times a day (TID) | ORAL | Status: DC | PRN

## 2014-05-10 MED ORDER — METHYLPREDNISOLONE SODIUM SUCC 125 MG IJ SOLR
80.0000 mg | Freq: Four times a day (QID) | INTRAMUSCULAR | Status: DC
  Administered 2014-05-10 – 2014-05-11 (×4): 80 mg via INTRAVENOUS
  Filled 2014-05-10 (×2): qty 1.28
  Filled 2014-05-10: qty 2
  Filled 2014-05-10: qty 1.28
  Filled 2014-05-10: qty 2
  Filled 2014-05-10: qty 1.28

## 2014-05-10 MED ORDER — ALBUTEROL (5 MG/ML) CONTINUOUS INHALATION SOLN
5.0000 mg/h | INHALATION_SOLUTION | RESPIRATORY_TRACT | Status: DC | PRN
  Filled 2014-05-10: qty 20

## 2014-05-10 MED ORDER — ALBUTEROL (5 MG/ML) CONTINUOUS INHALATION SOLN
10.0000 mg/h | INHALATION_SOLUTION | Freq: Once | RESPIRATORY_TRACT | Status: AC
  Administered 2014-05-10: 10 mg/h via RESPIRATORY_TRACT

## 2014-05-10 MED ORDER — ONDANSETRON HCL 4 MG/2ML IJ SOLN
4.0000 mg | Freq: Once | INTRAMUSCULAR | Status: AC
  Administered 2014-05-10: 4 mg via INTRAVENOUS
  Filled 2014-05-10: qty 2

## 2014-05-10 MED ORDER — IPRATROPIUM-ALBUTEROL 0.5-2.5 (3) MG/3ML IN SOLN
3.0000 mL | Freq: Four times a day (QID) | RESPIRATORY_TRACT | Status: DC
  Administered 2014-05-10 – 2014-05-11 (×3): 3 mL via RESPIRATORY_TRACT
  Filled 2014-05-10 (×3): qty 3

## 2014-05-10 MED ORDER — GUAIFENESIN-DM 100-10 MG/5ML PO SYRP
5.0000 mL | ORAL_SOLUTION | ORAL | Status: DC | PRN
  Filled 2014-05-10: qty 5

## 2014-05-10 MED ORDER — ONDANSETRON HCL 4 MG/2ML IJ SOLN: 4.0000 mg | Freq: Once | INTRAMUSCULAR | Status: DC

## 2014-05-10 MED ORDER — KETOROLAC TROMETHAMINE 30 MG/ML IJ SOLN
30.0000 mg | Freq: Once | INTRAMUSCULAR | Status: AC
  Administered 2014-05-10: 30 mg via INTRAVENOUS
  Filled 2014-05-10: qty 1

## 2014-05-10 MED ORDER — OSELTAMIVIR PHOSPHATE 75 MG PO CAPS
75.0000 mg | ORAL_CAPSULE | Freq: Two times a day (BID) | ORAL | Status: DC
  Administered 2014-05-10 – 2014-05-11 (×2): 75 mg via ORAL
  Filled 2014-05-10 (×3): qty 1

## 2014-05-10 NOTE — Progress Notes (Signed)
Request for admission for acute respiratory failure secondary to acute asthma exacerbation. Requiring cont neb treatment. NO history of intubations. Stable for admit to tele bed.   Anna PrestoMAGICK-Secundino Ellithorpe, MD  [facility] Pager [phone]  If 7PM-7AM, please contact night-coverage [email or web address] Password [email or web address]

## 2014-05-10 NOTE — H&P (Signed)
Patient Demographics  Anna Webb, is a 29 y.o. female  MRN: [id number]   DOB - 04-29-1985  Admit Date - 05/10/2014  Outpatient Primary MD for the patient is Jeanine Luz, FNP   With History of -  Past Medical History  Diagnosis Date  . Anxiety   . GERD (gastroesophageal reflux disease)   . Asthma   . Chicken pox   . Migraines   . Eczema       History reviewed. No pertinent past surgical history.  in for   Chief Complaint  Patient presents with  . Shortness of Breath     HPI  Anna Webb  is a 29 y.o. female, history of asthma, GERD, migraine headaches, anxiety, ongoing smoking, who is migrated from [place] comes to the ER with 2 week history of gradually progressive dry cough, shortness of breath, some body aches for the last few days, she was experiencing increasing amounts of shortness of breath with wheezing for the last 2 days, this morning she was unable to catch her breath and hence came to [facility]. At [facility] she was diagnosed with acute respiratory failure due to asthma exacerbation. Her blood works adjusted mild leukocytosis, hypokalemia, chest x-ray was unremarkable, she was afebrile, she was then transferred to [facility] for further care.   Patient denies any fever or chills but does have some body aches for the last few days, no chest pain or palpitations, denies any recent travel, no personal or family history of blood clots, she does take a lot of Motrin and yesterday she had a dark stool 1, denies any abdominal pain, no blood or mucus in urine, no dysuria, no joint pains or aches or focal weakness.    Review of Systems    In addition to the HPI above,   No Fever-chills, No Headache, No changes with Vision or hearing, No  problems swallowing food or Liquids, No Chest pain, ++ dry Cough with wheezing & Shortness of Breath, No Abdominal pain, No Nausea or Vommitting, Bowel movements are regular, No Blood in stool or Urine, No dysuria, No new skin rashes or bruises, No new joints pains-aches, +ve body aches No new weakness, tingling, numbness in any extremity, No recent weight gain or loss, No polyuria, polydypsia or polyphagia, No significant Mental Stressors.  A full 10 point Review of Systems was done, except as stated above, all other Review of Systems were negative.   Social History History  Substance Use Topics  . Smoking status: Current Every Day Smoker -- 0.25 packs/day for 6 years    Types: Cigarettes  . Smokeless tobacco: Never Used  . Alcohol Use: Yes     Comment: occasionally      Family History Family History  Problem Relation Age of Onset  . Hyperlipidemia Mother   . Hypertension Mother   . Drug abuse Father   .  Diabetes Father   . Arthritis Maternal Grandmother   . Lung cancer Maternal Grandmother   . Prostate cancer Maternal Grandfather       Prior to Admission medications   Medication Sig Start Date End Date Taking? Authorizing Provider  ALPRAZolam Prudy Feeler) 0.5 MG tablet TAKE 1 TABLET BY MOUTH AT BEDTIME AS NEEDED 04/28/14   Veryl Speak, FNP  [facility] GLYCERIN ADULT 2 G SUPP Place 2 g rectally daily. 02/04/14   Historical Provider, MD  levonorgestrel-ethinyl estradiol (AVIANE,ALESSE,LESSINA) 0.1-20 MG-MCG tablet Take 1 tablet by mouth daily.    Historical Provider, MD  naproxen (NAPROSYN) 500 MG tablet Take 500 mg by mouth 2 (two) times daily with a meal.    Historical Provider, MD  pantoprazole (PROTONIX) 20 MG tablet Take 20 mg by mouth daily.    Historical Provider, MD  PRESCRIPTION MEDICATION     Historical Provider, MD    Allergies  Allergen Reactions  . Penicillins Rash    Physical Exam  Vitals  Blood pressure 128/68, pulse 102, temperature 97.9 F (36.6 C),  temperature source Oral, resp. rate 22, height  (1.626 m), weight 69.536 kg (153 lb 4.8 oz), SpO2 98 %, unknown if currently breastfeeding.   1. General young AA female lying in bed in NAD,     2. Normal affect and insight, Not Suicidal or Homicidal, Awake Alert, Oriented X 3.  3. No F.N deficits, ALL C.Nerves Intact, Strength 5/5 all 4 extremities, Sensation intact all 4 extremities, Plantars down going.  4. Ears and Eyes appear Normal, Conjunctivae clear, PERRLA. Moist Oral Mucosa.  5. Supple Neck, No JVD, No cervical lymphadenopathy appriciated, No Carotid Bruits.  6. Symmetrical Chest wall movement, Good air movement bilaterally, bilat wheezing  7. RRR, No Gallops, Rubs or Murmurs, No Parasternal Heave.  8. Positive Bowel Sounds, Abdomen Soft, No tenderness, No organomegaly appriciated,No rebound -guarding or rigidity.  9.  No Cyanosis, Normal Skin Turgor, No Skin Rash or Bruise.  10. Good muscle tone,  joints appear normal , no effusions, Normal ROM.  11. No Palpable Lymph Nodes in Neck or Axillae     Data Review  CBC  Recent Labs Lab 05/10/14 0900  WBC 12.3*  HGB 14.5  HCT 42.3  PLT 227  MCV 86.0  MCH 29.5  MCHC 34.3  RDW 12.6  LYMPHSABS 1.1  MONOABS 0.9  EOSABS 0.0  BASOSABS 0.0   ------------------------------------------------------------------------------------------------------------------  Chemistries   Recent Labs Lab 05/10/14 0900  NA 138  K 3.2*  CL 104  CO2 22  GLUCOSE 124*  BUN 8  CREATININE 0.77  CALCIUM 9.1   ------------------------------------------------------------------------------------------------------------------ estimated creatinine clearance is 101.1 mL/min (by C-G formula based on Cr of 0.77). ------------------------------------------------------------------------------------------------------------------ No results for input(s): TSH, T4TOTAL, T3FREE, THYROIDAB in the last 72 hours.  Invalid input(s):  FREET3   Coagulation profile No results for input(s): INR, PROTIME in the last 168 hours. ------------------------------------------------------------------------------------------------------------------- No results for input(s): DDIMER in the last 72 hours. -------------------------------------------------------------------------------------------------------------------  Cardiac Enzymes No results for input(s): CKMB, TROPONINI, MYOGLOBIN in the last 168 hours.  Invalid input(s): CK ------------------------------------------------------------------------------------------------------------------ Invalid input(s): POCBNP   ---------------------------------------------------------------------------------------------------------------  Urinalysis    Component Value Date/Time   COLORURINE YELLOW 06/11/2013 0910   APPEARANCEUR CLEAR 06/11/2013 0910   LABSPEC 1.025 06/11/2013 0910   PHURINE 5.5 06/11/2013 0910   GLUCOSEU NEGATIVE 06/11/2013 0910   HGBUR SMALL* 06/11/2013 0910   BILIRUBINUR NEGATIVE 06/11/2013 0910   KETONESUR NEGATIVE 06/11/2013 0910   PROTEINUR NEGATIVE 06/11/2013 0910  UROBILINOGEN 0.2 06/11/2013 0910   NITRITE NEGATIVE 06/11/2013 0910   LEUKOCYTESUR NEGATIVE 06/11/2013 0910    ----------------------------------------------------------------------------------------------------------------  Imaging results:   Dg Chest 2 View  05/10/2014   CLINICAL DATA:  Two day history of wheezing, cough, chills, nausea, vomiting and diarrhea. Current history of asthma. Current smoker.  EXAM: CHEST  2 VIEW  COMPARISON:  None.  FINDINGS: Cardiomediastinal silhouette unremarkable. Lungs clear. Bronchovascular markings normal. Pulmonary vascularity normal. No pneumothorax. No pleural effusions. Visualized bony thorax intact.  IMPRESSION: Normal examination.   Electronically Signed   By: Hulan Saashomas  Lawrence M.D.   On: 05/10/2014 10:19         Assessment & Plan    1. Acute  respiratory failure due to asthma exacerbation. Will be admitted, on telemetry which will be continued, IV steroids, azithromycin, scheduled and as needed nebulizer treatment, since she has body aches will check for influenza, place on Tamiflu. She has been counseled to quit smoking. Have ordered HIV and sputum Gram stain culture. For now she will be placed on oxygen, will also check a baseline ABG.   2. HX of smoking. Counseled to quit.    3. Liberal use of NSAIDs with one melanotic stool yesterday. She is not anemic, counseled not to use NSAIDs in the future, she does have GERD, for now place her on IV PPI, no melena or frank bleeding today. She is not anemic. We'll request outpatient GI follow-up if no further inpatient issues.    4. History of anxiety. Xanax as needed.    5. Hypokalemia. Replace to recheck in the morning.    Recheck a baseline UA with urine pregnancy, she dose not have a female sexual partner and is unlikely  Pregnant.    DVT Prophylaxis   SCDs    AM Labs Ordered, also please review Full Orders  Family Communication: Admission, patients condition and plan of care including tests being ordered have been discussed with the patient  who indicates understanding and agree with the plan and Code Status.  Code Status Full  Likely DC to Home  Condition Fair  Time spent in minutes : 35    Susa RaringSINGH,PRASHANT K M.D on 05/10/2014 at 5:23 PM  Between 7am to 7pm - Pager - [phone]  After 7pm go to [email or web address] - password [email or web address]  [facility]  Office  [phone]

## 2014-05-10 NOTE — ED Notes (Signed)
Patient here with SOB, had URI a few weeks, N/V since Friday and unable to keep much down. Wheezing, dry cough

## 2014-05-10 NOTE — ED Provider Notes (Addendum)
CSN: [id number]     Arrival date & time 05/10/14  [id number] History   First MD Initiated Contact with Patient 05/10/14 [phone]     Chief Complaint  Patient presents with  . Shortness of Breath      HPI  Evaluation of shortness of breath. Sort of breath for the last several days. Distant history of bronchospasm as a child. Does not use albuterol routinely for symptoms worsening over last 2 days more short of breath. Feels tight and wheezing. Patient's symptoms became suddenly worse this morning.  No pain. No fever. Frequent cough.  Past Medical History  Diagnosis Date  . Anxiety   . GERD (gastroesophageal reflux disease)   . Asthma   . Chicken pox   . Migraines   . Eczema    History reviewed. No pertinent past surgical history. Family History  Problem Relation Age of Onset  . Hyperlipidemia Mother   . Hypertension Mother   . Drug abuse Father   . Diabetes Father   . Arthritis Maternal Grandmother   . Lung cancer Maternal Grandmother   . Prostate cancer Maternal Grandfather    History  Substance Use Topics  . Smoking status: Current Every Day Smoker -- 0.25 packs/day for 6 years    Types: Cigarettes  . Smokeless tobacco: Never Used  . Alcohol Use: Yes     Comment: occasionally   OB History    Gravida Para Term Preterm AB TAB SAB Ectopic Multiple Living   1              Review of Systems  Constitutional: Negative for chills, diaphoresis, appetite change and fatigue.  HENT: Positive for congestion. Negative for mouth sores, sore throat and trouble swallowing.   Eyes: Negative for visual disturbance.  Respiratory: Positive for cough, shortness of breath and wheezing. Negative for chest tightness.   Cardiovascular: Positive for chest pain.  Gastrointestinal: Positive for nausea. Negative for vomiting, abdominal pain, diarrhea and abdominal distention.  Endocrine: Negative for polydipsia, polyphagia and polyuria.  Genitourinary: Negative for dysuria, frequency and hematuria.   Musculoskeletal: Negative for gait problem.  Skin: Negative for color change, pallor and rash.  Neurological: Negative for dizziness, syncope, light-headedness and headaches.  Hematological: Does not bruise/bleed easily.  Psychiatric/Behavioral: Negative for behavioral problems and confusion.      Allergies  Penicillins  Home Medications   Prior to Admission medications   Medication Sig Start Date End Date Taking? Authorizing Provider  ALPRAZolam Prudy Feeler(XANAX) 0.5 MG tablet TAKE 1 TABLET BY MOUTH AT BEDTIME AS NEEDED 04/28/14   Veryl SpeakGregory D Calone, FNP  levonorgestrel-ethinyl estradiol (AVIANE,ALESSE,LESSINA) 0.1-20 MG-MCG tablet Take 1 tablet by mouth daily.    Historical Provider, MD  naproxen (NAPROSYN) 500 MG tablet Take 500 mg by mouth 2 (two) times daily with a meal.    Historical Provider, MD  pantoprazole (PROTONIX) 20 MG tablet Take 20 mg by mouth daily.    Historical Provider, MD  PRESCRIPTION MEDICATION     Historical Provider, MD   BP 134/77 mmHg  Pulse 152  Temp(Src) 97.7 F (36.5 C) (Axillary)  Resp 24  Ht 5\' 4"  (1.626 m)  Wt 158 lb (71.668 kg)  BMI 27.11 kg/m2  SpO2 100% Physical Exam  Constitutional: She is oriented to person, place, and time. She appears well-developed and well-nourished. She appears distressed.  HENT:  Head: Normocephalic.  Eyes: Conjunctivae are normal. Pupils are equal, round, and reactive to light. No scleral icterus.  Neck: Normal range of motion. Neck supple.  No thyromegaly present.  Cardiovascular: Normal rate and regular rhythm.  Exam reveals no gallop and no friction rub.   No murmur heard. Pulmonary/Chest: Accessory muscle usage present. Tachypnea noted. She is in respiratory distress. She has wheezes. She has no rales.  2-3 word dyspnea  Abdominal: Soft. Bowel sounds are normal. She exhibits no distension. There is no tenderness. There is no rebound.  Musculoskeletal: Normal range of motion.  Neurological: She is alert and oriented to  person, place, and time.  Skin: Skin is warm and dry. No rash noted.  Psychiatric: She has a normal mood and affect. Her behavior is normal.    ED Course  Procedures (including critical care time) Labs Review Labs Reviewed  CBC WITH DIFFERENTIAL/PLATELET - Abnormal; Notable for the following:    WBC 12.3 (*)    Neutrophils Relative % 84 (*)    Neutro Abs 10.3 (*)    Lymphocytes Relative 9 (*)    All other components within normal limits  BASIC METABOLIC PANEL - Abnormal; Notable for the following:    Potassium 3.2 (*)    Glucose, Bld 124 (*)    All other components within normal limits    Imaging Review Dg Chest 2 View  05/10/2014   CLINICAL DATA:  Two day history of wheezing, cough, chills, nausea, vomiting and diarrhea. Current history of asthma. Current smoker.  EXAM: CHEST  2 VIEW  COMPARISON:  None.  FINDINGS: Cardiomediastinal silhouette unremarkable. Lungs clear. Bronchovascular markings normal. Pulmonary vascularity normal. No pneumothorax. No pleural effusions. Visualized bony thorax intact.  IMPRESSION: Normal examination.   Electronically Signed   By: Hulan Saas M.D.   On: 05/10/2014 10:19     EKG Interpretation None      MDM   Final diagnoses:  SOB (shortness of breath)  Asthma exacerbation     Patient presents in mild restaurant distress. Tachypneic, audible wheezing. Prolongation. Afebrile. Not hypoxemic. Given albuterol neb. Reassess after 1 hour shows improved aeration with increased wheezing. Given 1 hour continuous nebulized albuterol.  X-ray shows no infiltrate or effusion. WBC 12.3. K of 3.2.  Patient given Zofran IV. Given morphine for her pleuritic chest pain. Reassess shows continued wheezing in all fields and prolongation. Neb around after first hour of continuous as she had a sinus tachycardia at 150. Heart rate improved down to 110. Reassessment shows continued prolongation peeled sats 94-96%.  Will need admission for status asthmaticus.  Does not appear fatigued, or somnolent. Gen. patient does appear less dyspneic. However subjectively is nowhere near her baseline.  Rolland Porter, MD 05/10/14 1314  Rolland Porter, MD 05/21/14 [phone]

## 2014-05-10 NOTE — ED Notes (Signed)
Report called to Tina, RN.

## 2014-05-10 NOTE — ED Notes (Signed)
Pt given crackers for nausea.

## 2014-05-10 NOTE — ED Notes (Signed)
Pt states pain much improved from previous IV morphine administration.  Pt request to hold off on 2nd IV morphine administration at this time.  Will reassess again in 1 hour.

## 2014-05-11 LAB — IRON AND TIBC
Iron: 14 ug/dL — ABNORMAL LOW (ref 42–145)
SATURATION RATIOS: 5 % — AB (ref 20–55)
TIBC: 264 ug/dL (ref 250–470)
UIBC: 250 ug/dL (ref 125–400)

## 2014-05-11 LAB — BASIC METABOLIC PANEL
Anion gap: 4 — ABNORMAL LOW (ref 5–15)
BUN: 5 mg/dL — ABNORMAL LOW (ref 6–23)
CO2: 22 mmol/L (ref 19–32)
CREATININE: 0.7 mg/dL (ref 0.50–1.10)
Calcium: 8.7 mg/dL (ref 8.4–10.5)
Chloride: 112 mmol/L (ref 96–112)
GFR calc Af Amer: >90 mL/min (ref >90)
GFR calc non Af Amer: >90 mL/min (ref >90)
GLUCOSE: 131 mg/dL — AB (ref 70–99)
Potassium: 4.3 mmol/L (ref 3.5–5.1)
Sodium: 138 mmol/L (ref 135–145)

## 2014-05-11 LAB — ABO/RH: ABO/RH(D): O POS

## 2014-05-11 LAB — CBC
HCT: 33.3 % — ABNORMAL LOW (ref 36.0–46.0)
HEMOGLOBIN: 11.5 g/dL — AB (ref 12.0–15.0)
MCH: 29.2 pg (ref 26.0–34.0)
MCHC: 34.5 g/dL (ref 30.0–36.0)
MCV: 84.5 fL (ref 78.0–100.0)
PLATELETS: 212 10*3/uL (ref 150–400)
RBC: 3.94 MIL/uL (ref 3.87–5.11)
RDW: 13.1 % (ref 11.5–15.5)
WBC: 17.7 10*3/uL — ABNORMAL HIGH (ref 4.0–10.5)

## 2014-05-11 LAB — INFLUENZA PANEL BY PCR (TYPE A & B)
H1N1 flu by pcr: DETECTED — AB
INFLBPCR: NEGATIVE
Influenza A By PCR: POSITIVE — AB

## 2014-05-11 LAB — RAPID URINE DRUG SCREEN, HOSP PERFORMED
AMPHETAMINES: NOT DETECTED
BARBITURATES: NOT DETECTED
Benzodiazepines: NOT DETECTED
Cocaine: NOT DETECTED
Opiates: POSITIVE — AB
Tetrahydrocannabinol: POSITIVE — AB

## 2014-05-11 LAB — FOLATE: Folate: 17.2 ng/mL

## 2014-05-11 LAB — HIV ANTIBODY (ROUTINE TESTING W REFLEX): HIV Screen 4th Generation wRfx: NONREACTIVE

## 2014-05-11 LAB — VITAMIN B12: Vitamin B-12: 687 pg/mL (ref 211–911)

## 2014-05-11 LAB — FERRITIN: Ferritin: 223 ng/mL (ref 10–291)

## 2014-05-11 MED ORDER — FLUTICASONE-SALMETEROL 500-50 MCG/DOSE IN AEPB: 1.0000 | INHALATION_SPRAY | Freq: Two times a day (BID) | RESPIRATORY_TRACT | Status: DC

## 2014-05-11 MED ORDER — PREDNISONE 10 MG PO TABS: ORAL_TABLET | ORAL | Status: DC

## 2014-05-11 MED ORDER — MONTELUKAST SODIUM 4 MG PO CHEW: 4.0000 mg | CHEWABLE_TABLET | Freq: Every day | ORAL | Status: DC

## 2014-05-11 MED ORDER — PANTOPRAZOLE SODIUM 40 MG PO TBEC
40.0000 mg | DELAYED_RELEASE_TABLET | Freq: Two times a day (BID) | ORAL | Status: DC
  Administered 2014-05-11: 40 mg via ORAL
  Filled 2014-05-11: qty 1

## 2014-05-11 MED ORDER — OSELTAMIVIR PHOSPHATE 75 MG PO CAPS: 75.0000 mg | ORAL_CAPSULE | Freq: Two times a day (BID) | ORAL | Status: DC

## 2014-05-11 MED ORDER — ALBUTEROL SULFATE HFA 108 (90 BASE) MCG/ACT IN AERS: 2.0000 | INHALATION_SPRAY | Freq: Four times a day (QID) | RESPIRATORY_TRACT | Status: DC | PRN

## 2014-05-11 NOTE — Progress Notes (Signed)
UR Completed.  [phone]  

## 2014-05-11 NOTE — Progress Notes (Signed)
DC IV, DC Tele, DC Home. Discharge instructions and home instructions discussed with patient and patient's significant other. Patient denied any questions or concerns at this time. Patient leaving unit via wheelchair and appears in  No acute distress.

## 2014-05-11 NOTE — Discharge Summary (Signed)
Physician Discharge Summary  Anna Webb ZOX:[id number] DOB: 01/19/1986 DOA: 05/10/2014  PCP: Anna Luz, FNP  Admit date: 05/10/2014 Discharge date: 05/11/2014  Time spent: 30 minutes  Recommendations for Outpatient Follow-up:  1. Follow up with PCP with PCP in 2 weeks.  Discharge Diagnoses:  Principal Problem:   Acute respiratory failure Active Problems:   Generalized anxiety disorder   GERD (gastroesophageal reflux disease)   Asthma exacerbation   Discharge Condition: stable  Diet recommendation: regular  Filed Weights   05/10/14 0831 05/10/14 1647 05/11/14 0635  Weight: 71.668 kg (158 lb) 69.536 kg (153 lb 4.8 oz) 69.212 kg (152 lb 9.4 oz)    History of present illness:  29 y.o. female, history of asthma, GERD, migraine headaches, anxiety, ongoing smoking, who is migrated from [place] comes to the ER with 2 week history of gradually progressive dry cough, shortness of breath, some body aches for the last few days, she was experiencing increasing amounts of shortness of breath with wheezing for the last 2 days, this morning she was unable to catch her breath and hence came to [facility]. At [facility] she was diagnosed with acute respiratory failure due to asthma exacerbation. Her blood works adjusted mild leukocytosis, hypokalemia, chest x-ray was unremarkable, she was afebrile, she was then transferred to [facility] for further care.   Hospital Course:  Acute respiratory failure with hypoxia due to asthma exacerbation: She was started on IV steroids, Singulair and albuterol. If Influenza PCR was positive so she was started on Tamiflu. By the next day she was able to ambulate without desaturating, she will be sent home on steroid taper, Advair and albuterol when necessary. She'll follow-up with her PCP in 2-4 weeks.  History of anxiety: Continue Xanax.  Hypokalemia: Due to albuterol now resolved.  NSAIDs USE: - No  melanotic stools in the hospital, she had one bowel movement  that did not show any blood.  - She is on Protonix twice a day. There is a mild drop in hemoglobin most likely due to hemoconcentration.   Procedures:  CXR  Consultations:  none  Discharge Exam: Filed Vitals:   05/11/14 1052  BP: 101/57  Pulse: 74  Temp:   Resp:     General: A&O x3 Cardiovascular: RRR Respiratory: good air movement CTA B/L  Discharge Instructions   Discharge Instructions    Diet - low sodium heart healthy    Complete by:  As directed      Increase activity slowly    Complete by:  As directed           Current Discharge Medication List    START taking these medications   Details  albuterol (PROVENTIL HFA;VENTOLIN HFA) 108 (90 BASE) MCG/ACT inhaler Inhale 2 puffs into the lungs every 6 (six) hours as needed for wheezing or shortness of breath. Qty: 1 Inhaler, Refills: 2    Fluticasone-Salmeterol (ADVAIR DISKUS) 500-50 MCG/DOSE AEPB Inhale 1 puff into the lungs 2 (two) times daily. Qty: 60 each, Refills: 0    montelukast (SINGULAIR) 4 MG chewable tablet Chew 1 tablet (4 mg total) by mouth at bedtime. Qty: 30 tablet, Refills: 0    oseltamivir (TAMIFLU) 75 MG capsule Take 1 capsule (75 mg total) by mouth 2 (two) times daily. Qty: 9 capsule, Refills: 0    predniSONE (DELTASONE) 10 MG tablet Takes 6 tablets for 1 days, then 5 tablets for 1 days, then 4 tablets for 1 days,  then 3 tablets for 1 days, then 2 tabs for 1 days, then 1 tab for 1 days, and then stop. Qty: 21 tablet, Refills: 0      CONTINUE these medications which have NOT CHANGED   Details  [facility] GLYCERIN ADULT 2 G SUPP Place 2 g rectally daily as needed for moderate constipation.  Refills: 2    diphenhydrAMINE (BENADRYL) 25 MG tablet Take 25 mg by mouth every 6 (six) hours as needed for allergies.    loratadine (CLARITIN) 10 MG tablet Take 10 mg by mouth daily.    ALPRAZolam (XANAX) 0.5 MG tablet TAKE 1 TABLET BY MOUTH AT  BEDTIME AS NEEDED Qty: 30 tablet, Refills: 0    levonorgestrel-ethinyl estradiol (AVIANE,ALESSE,LESSINA) 0.1-20 MG-MCG tablet Take 1 tablet by mouth daily.    pantoprazole (PROTONIX) 20 MG tablet Take 20 mg by mouth daily.      STOP taking these medications     PRESCRIPTION MEDICATION      naproxen (NAPROSYN) 500 MG tablet        Allergies  Allergen Reactions  . Aspirin Nausea And Vomiting  . Penicillins Rash  . Tomato Rash      The results of significant diagnostics from this hospitalization (including imaging, microbiology, ancillary and laboratory) are listed below for reference.    Significant Diagnostic Studies: Dg Chest 2 View  05/10/2014   CLINICAL DATA:  Two day history of wheezing, cough, chills, nausea, vomiting and diarrhea. Current history of asthma. Current smoker.  EXAM: CHEST  2 VIEW  COMPARISON:  None.  FINDINGS: Cardiomediastinal silhouette unremarkable. Lungs clear. Bronchovascular markings normal. Pulmonary vascularity normal. No pneumothorax. No pleural effusions. Visualized bony thorax intact.  IMPRESSION: Normal examination.   Electronically Signed   By: Hulan Saashomas  Lawrence M.D.   On: 05/10/2014 10:19    Microbiology: No results found for this or any previous visit (from the past 240 hour(s)).   Labs: Basic Metabolic Panel:  Recent Labs Lab 05/10/14 0900 05/11/14 0422  NA 138 138  K 3.2* 4.3  CL 104 112  CO2 22 22  GLUCOSE 124* 131*  BUN 8 5*  CREATININE 0.77 0.70  CALCIUM 9.1 8.7   Liver Function Tests: No results for input(s): AST, ALT, ALKPHOS, BILITOT, PROT, ALBUMIN in the last 168 hours. No results for input(s): LIPASE, AMYLASE in the last 168 hours. No results for input(s): AMMONIA in the last 168 hours. CBC:  Recent Labs Lab 05/10/14 0900 05/11/14 0422  WBC 12.3* 17.7*  NEUTROABS 10.3*  --   HGB 14.5 11.5*  HCT 42.3 33.3*  MCV 86.0 84.5  PLT 227 212   Cardiac Enzymes: No results for input(s): CKTOTAL, CKMB, CKMBINDEX,  TROPONINI in the last 168 hours. BNP: BNP (last 3 results) No results for input(s): BNP in the last 8760 hours.  ProBNP (last 3 results) No results for input(s): PROBNP in the last 8760 hours.  CBG: No results for input(s): GLUCAP in the last 168 hours.     Signed:  Marinda ElkFELIZ ORTIZ, ABRAHAM  [facility] 05/11/2014, 12:05 PM

## 2014-05-11 NOTE — Discharge Instructions (Signed)
Anna Webb was admitted to the Hospital on 05/10/2014 and Discharged on Discharge Date 05/11/2014 and should be excused from work/school   for 2   days starting 05/10/2014 , may return to work/school without any restrictions.  Call Anna KetoAbraham Feliz MD, [facility] [phone] with questions.  Anna ElkFELIZ ORTIZ, Anna Webb M.D on 05/11/2014,at 1:03 PM  [facility] Office  [phone]

## 2014-05-12 LAB — URINE CULTURE

## 2014-06-28 ENCOUNTER — Other Ambulatory Visit: Payer: Self-pay | Admitting: Family

## 2014-06-29 NOTE — Telephone Encounter (Signed)
Refilled. Need OV for any additional refills.

## 2014-06-29 NOTE — Telephone Encounter (Signed)
Faxed to pharmacy. LVM letting pt know about follow up OV in order for more refills.

## 2014-07-01 ENCOUNTER — Encounter: Payer: Self-pay | Admitting: Family

## 2014-07-01 ENCOUNTER — Ambulatory Visit (INDEPENDENT_AMBULATORY_CARE_PROVIDER_SITE_OTHER): Payer: BLUE CROSS/BLUE SHIELD | Admitting: Family

## 2014-07-01 DIAGNOSIS — F411 Generalized anxiety disorder: Secondary | ICD-10-CM | POA: Diagnosis not present

## 2014-07-01 DIAGNOSIS — J4540 Moderate persistent asthma, uncomplicated: Secondary | ICD-10-CM | POA: Diagnosis not present

## 2014-07-01 MED ORDER — PREDNISONE 20 MG PO TABS: 20.0000 mg | ORAL_TABLET | Freq: Two times a day (BID) | ORAL | Status: DC

## 2014-07-01 MED ORDER — ALPRAZOLAM 0.5 MG PO TABS: 0.5000 mg | ORAL_TABLET | Freq: Every evening | ORAL | Status: DC | PRN

## 2014-07-01 NOTE — Progress Notes (Signed)
Subjective:    Patient ID: Anna Webb, female    DOB: Oct 03, 1985, 29 y.o.   MRN: [id number]  Chief Complaint  Patient presents with  . Follow-up    Notices at night time that she wakes up out of her sleep coughing and having SOB until she uses her inhaler, wants to see if there is a mask she can use at night    HPI:  Anna Parcellisha S Ulatowski is a 29 y.o. female with a PMH of asthma, eczema and anxiety who presents today for a hospital follow up.   1.) Asthma: In March she presented to the hospital with a 2 week history of progressive cough, shortness of breath and wheezing. She was admitted and treated for acute respiratory failure and asthma exacerbation with steroids, singulair and albuterol. She was also started on Advair. All hospital records were reviewed in detail.   Since her hospital admission she reports a slight improvement in her asthma, however continues to experience the associated symptoms of night time awakenings 1-2 times per week and daily use of her albuterol. The last albuterol treatment was performed this morning which seems to help. She is not currently experiencing symptoms at present and indicates the timing of the symptoms is worse at night.  2.) Anxiety: Anxiety is stable with current regimen. Currently treated with Xanax. Denies adverse effects of medication or worsening of symptoms  Allergies  Allergen Reactions  . Aspirin Nausea And Vomiting  . Penicillins Rash  . Tomato Rash    Current Outpatient Prescriptions on File Prior to Visit  Medication Sig Dispense Refill  . albuterol (PROVENTIL HFA;VENTOLIN HFA) 108 (90 BASE) MCG/ACT inhaler Inhale 2 puffs into the lungs every 6 (six) hours as needed for wheezing or shortness of breath. 1 Inhaler 2  . ALPRAZolam (XANAX) 0.5 MG tablet TAKE 1 TABLET BY MOUTH AT BEDTIME AS NEEDED 30 tablet 0  . [facility] GLYCERIN ADULT 2 G SUPP Place 2 g rectally daily as needed for moderate constipation.   2  . diphenhydrAMINE (BENADRYL) 25 MG  tablet Take 25 mg by mouth every 6 (six) hours as needed for allergies.    . Fluticasone-Salmeterol (ADVAIR DISKUS) 500-50 MCG/DOSE AEPB Inhale 1 puff into the lungs 2 (two) times daily. 60 each 0  . levonorgestrel-ethinyl estradiol (AVIANE,ALESSE,LESSINA) 0.1-20 MG-MCG tablet Take 1 tablet by mouth daily.    Marland Kitchen. loratadine (CLARITIN) 10 MG tablet Take 10 mg by mouth daily.    . montelukast (SINGULAIR) 4 MG chewable tablet Chew 1 tablet (4 mg total) by mouth at bedtime. 30 tablet 0  . pantoprazole (PROTONIX) 20 MG tablet Take 20 mg by mouth daily.     No current facility-administered medications on file prior to visit.   Past Medical History  Diagnosis Date  . Anxiety   . GERD (gastroesophageal reflux disease)   . Asthma   . Chicken pox   . Migraines   . Eczema     Review of Systems  Constitutional: Negative for fever and chills.  Respiratory: Positive for cough. Negative for chest tightness, shortness of breath and wheezing.   Cardiovascular: Negative for chest pain.      Objective:    BP 120/78 mmHg  Pulse 87  Temp(Src) 98.5 F (36.9 C) (Oral)  Resp 18  Ht 5\' 4"  (1.626 m)  Wt 163 lb 1.9 oz (73.991 kg)  BMI 27.99 kg/m2  SpO2 98% Nursing note and vital signs reviewed.  Physical Exam  Constitutional: She is oriented to  person, place, and time. She appears well-developed and well-nourished. No distress.  Cardiovascular: Normal rate, regular rhythm, normal heart sounds and intact distal pulses.   Pulmonary/Chest: Effort normal and breath sounds normal. She has no wheezes. She has no rales.  Neurological: She is alert and oriented to person, place, and time.  Skin: Skin is warm and dry.  Psychiatric: She has a normal mood and affect. Her behavior is normal. Judgment and thought content normal.       Assessment & Plan:

## 2014-07-01 NOTE — Assessment & Plan Note (Signed)
Stable with current regimen of Xanax. Denies any adverse effects. Continue current dosage of Xanax.

## 2014-07-01 NOTE — Progress Notes (Signed)
Pre visit review using our clinic review tool, if applicable. No additional management support is needed unless otherwise documented below in the visit note. 

## 2014-07-01 NOTE — Patient Instructions (Signed)
Continue current asthma medications.   Please start the prednisone daily for the next 5-7 days and then as needed.  Thank you for choosing [facility].  Summary/Instructions:  Your prescription(s) have been submitted to your pharmacy or been printed and provided for you. Please take as directed and contact our office if you believe you are having problem(s) with the medication(s) or have any questions.  If your symptoms worsen or fail to improve, please contact our office for further instruction, or in case of emergency go directly to the emergency room at the closest medical facility.    Asthma Asthma is a recurring condition in which the airways tighten and narrow. Asthma can make it difficult to breathe. It can cause coughing, wheezing, and shortness of breath. Asthma episodes, also called asthma attacks, range from minor to life-threatening. Asthma cannot be cured, but medicines and lifestyle changes can help control it. CAUSES Asthma is believed to be caused by inherited (genetic) and environmental factors, but its exact cause is unknown. Asthma may be triggered by allergens, lung infections, or irritants in the air. Asthma triggers are different for each person. Common triggers include:   Animal dander.  Dust mites.  Cockroaches.  Pollen from trees or grass.  Mold.  Smoke.  Air pollutants such as dust, household cleaners, hair sprays, aerosol sprays, paint fumes, strong chemicals, or strong odors.  Cold air, weather changes, and winds (which increase molds and pollens in the air).  Strong emotional expressions such as crying or laughing hard.  Stress.  Certain medicines (such as aspirin) or types of drugs (such as beta-blockers).  Sulfites in foods and drinks. Foods and drinks that may contain sulfites include dried fruit, potato chips, and sparkling grape juice.  Infections or inflammatory conditions such as the flu, a cold, or an inflammation of the nasal  membranes (rhinitis).  Gastroesophageal reflux disease (GERD).  Exercise or strenuous activity. SYMPTOMS Symptoms may occur immediately after asthma is triggered or many hours later. Symptoms include:  Wheezing.  Excessive nighttime or early morning coughing.  Frequent or severe coughing with a common cold.  Chest tightness.  Shortness of breath. DIAGNOSIS  The diagnosis of asthma is made by a review of your medical history and a physical exam. Tests may also be performed. These may include:  Lung function studies. These tests show how much air you breathe in and out.  Allergy tests.  Imaging tests such as X-rays. TREATMENT  Asthma cannot be cured, but it can usually be controlled. Treatment involves identifying and avoiding your asthma triggers. It also involves medicines. There are 2 classes of medicine used for asthma treatment:   Controller medicines. These prevent asthma symptoms from occurring. They are usually taken every day.  Reliever or rescue medicines. These quickly relieve asthma symptoms. They are used as needed and provide short-term relief. Your health care provider will help you create an asthma action plan. An asthma action plan is a written plan for managing and treating your asthma attacks. It includes a list of your asthma triggers and how they may be avoided. It also includes information on when medicines should be taken and when their dosage should be changed. An action plan may also involve the use of a device called a peak flow meter. A peak flow meter measures how well the lungs are working. It helps you monitor your condition. HOME CARE INSTRUCTIONS   Take medicines only as directed by your health care provider. Speak with your health care provider if  you have questions about how or when to take the medicines.  Use a peak flow meter as directed by your health care provider. Record and keep track of readings.  Understand and use the action plan to help  minimize or stop an asthma attack without needing to seek medical care.  Control your home environment in the following ways to help prevent asthma attacks:  Do not smoke. Avoid being exposed to secondhand smoke.  Change your heating and air conditioning filter regularly.  Limit your use of fireplaces and wood stoves.  Get rid of pests (such as roaches and mice) and their droppings.  Throw away plants if you see mold on them.  Clean your floors and dust regularly. Use unscented cleaning products.  Try to have someone else vacuum for you regularly. Stay out of rooms while they are being vacuumed and for a short while afterward. If you vacuum, use a dust mask from a hardware store, a double-layered or microfilter vacuum cleaner bag, or a vacuum cleaner with a HEPA filter.  Replace carpet with wood, tile, or vinyl flooring. Carpet can trap dander and dust.  Use allergy-proof pillows, mattress covers, and box spring covers.  Wash bed sheets and blankets every week in hot water and dry them in a dryer.  Use blankets that are made of polyester or cotton.  Clean bathrooms and kitchens with bleach. If possible, have someone repaint the walls in these rooms with mold-resistant paint. Keep out of the rooms that are being cleaned and painted.  Wash hands frequently. SEEK MEDICAL CARE IF:   You have wheezing, shortness of breath, or a cough even if taking medicine to prevent attacks.  The colored mucus you cough up (sputum) is thicker than usual.  Your sputum changes from clear or white to yellow, green, gray, or bloody.  You have any problems that may be related to the medicines you are taking (such as a rash, itching, swelling, or trouble breathing).  You are using a reliever medicine more than 2-3 times per week.  Your peak flow is still at 50-79% of your personal best after following your action plan for 1 hour.  You have a fever. SEEK IMMEDIATE MEDICAL CARE IF:   You seem to be  getting worse and are unresponsive to treatment during an asthma attack.  You are short of breath even at rest.  You get short of breath when doing very little physical activity.  You have difficulty eating, drinking, or talking due to asthma symptoms.  You develop chest pain.  You develop a fast heartbeat.  You have a bluish color to your lips or fingernails.  You are light-headed, dizzy, or faint.  Your peak flow is less than 50% of your personal best. MAKE SURE YOU:   Understand these instructions.  Will watch your condition.  Will get help right away if you are not doing well or get worse. Document Released: 02/13/2005 Document Revised: 06/30/2013 Document Reviewed: 09/12/2012 [facility] Patient Information 2015 [employer] [place]. This information is not intended to replace advice given to you by your health care provider. Make sure you discuss any questions you have with your health care provider.

## 2014-07-01 NOTE — Assessment & Plan Note (Signed)
Asthma remains uncontrolled with current regimen as evidenced with nighttime awakenings and albuterol usage multiple times per day. Start prednisone. Refer to pulmonology to establish and for assistance in management. Follow-up pending pulmonology referral or sooner if needed.

## 2014-07-15 ENCOUNTER — Encounter: Payer: Self-pay | Admitting: Family

## 2014-08-03 ENCOUNTER — Other Ambulatory Visit: Payer: Self-pay | Admitting: Family

## 2014-10-04 ENCOUNTER — Other Ambulatory Visit: Payer: Self-pay | Admitting: Family

## 2014-10-05 NOTE — Telephone Encounter (Signed)
Medication refilled

## 2014-10-28 ENCOUNTER — Other Ambulatory Visit: Payer: Self-pay

## 2014-10-28 MED ORDER — FLUTICASONE-SALMETEROL 500-50 MCG/DOSE IN AEPB: 1.0000 | INHALATION_SPRAY | Freq: Two times a day (BID) | RESPIRATORY_TRACT | Status: DC

## 2014-10-29 ENCOUNTER — Other Ambulatory Visit: Payer: Self-pay

## 2014-10-29 MED ORDER — FLUTICASONE-SALMETEROL 500-50 MCG/DOSE IN AEPB: 1.0000 | INHALATION_SPRAY | Freq: Two times a day (BID) | RESPIRATORY_TRACT | Status: DC

## 2014-11-23 ENCOUNTER — Other Ambulatory Visit: Payer: Self-pay | Admitting: Family

## 2014-11-23 NOTE — Telephone Encounter (Signed)
Last refill 8/8.  

## 2014-12-30 ENCOUNTER — Other Ambulatory Visit: Payer: Self-pay | Admitting: Family

## 2014-12-31 NOTE — Telephone Encounter (Signed)
Last refill was 9/27

## 2015-02-01 ENCOUNTER — Other Ambulatory Visit: Payer: Self-pay | Admitting: Family

## 2015-02-01 NOTE — Telephone Encounter (Signed)
Last refill was 11/3 

## 2015-02-28 HISTORY — PX: GYNECOLOGIC CRYOSURGERY: SHX857

## 2015-03-03 ENCOUNTER — Other Ambulatory Visit: Payer: Self-pay | Admitting: Family

## 2015-03-03 NOTE — Telephone Encounter (Signed)
Please advise, thanks.

## 2015-03-04 NOTE — Telephone Encounter (Signed)
[facility]

## 2015-03-07 ENCOUNTER — Other Ambulatory Visit: Payer: Self-pay | Admitting: Family

## 2015-03-08 NOTE — Telephone Encounter (Signed)
Please advise, thanks.

## 2015-03-08 NOTE — Telephone Encounter (Signed)
Please check with pharmacy as this was refilled on 03/03/15.

## 2015-03-09 NOTE — Telephone Encounter (Signed)
Pharmacy never received it. Can you please refill.

## 2015-04-10 ENCOUNTER — Other Ambulatory Visit: Payer: Self-pay | Admitting: Family

## 2015-04-20 ENCOUNTER — Encounter: Payer: Self-pay | Admitting: Family

## 2015-04-20 ENCOUNTER — Ambulatory Visit (INDEPENDENT_AMBULATORY_CARE_PROVIDER_SITE_OTHER): Payer: BLUE CROSS/BLUE SHIELD | Admitting: Family

## 2015-04-20 VITALS — BP 110/80 | HR 65 | Temp 98.3°F | Resp 18 | Ht 64.0 in | Wt 164.0 lb

## 2015-04-20 DIAGNOSIS — M6740 Ganglion, unspecified site: Secondary | ICD-10-CM

## 2015-04-20 DIAGNOSIS — J45909 Unspecified asthma, uncomplicated: Secondary | ICD-10-CM | POA: Insufficient documentation

## 2015-04-20 DIAGNOSIS — J4530 Mild persistent asthma, uncomplicated: Secondary | ICD-10-CM | POA: Diagnosis not present

## 2015-04-20 MED ORDER — FLUTICASONE-SALMETEROL 500-50 MCG/DOSE IN AEPB: 1.0000 | INHALATION_SPRAY | Freq: Two times a day (BID) | RESPIRATORY_TRACT | Status: DC

## 2015-04-20 MED ORDER — ALBUTEROL SULFATE HFA 108 (90 BASE) MCG/ACT IN AERS: 2.0000 | INHALATION_SPRAY | Freq: Four times a day (QID) | RESPIRATORY_TRACT | Status: DC | PRN

## 2015-04-20 NOTE — Progress Notes (Signed)
Pre visit review using our clinic review tool, if applicable. No additional management support is needed unless otherwise documented below in the visit note. 

## 2015-04-20 NOTE — Assessment & Plan Note (Signed)
Symptoms and exam consistent with ganglion cyst. Given location, refer to orthopedics for drainage or removal. Follow-up if symptoms worsen or fail to improve.

## 2015-04-20 NOTE — Progress Notes (Signed)
Subjective:    Patient ID: Anna Webb, female    DOB: June 12, 1985, 30 y.o.   MRN: [id number]  Chief Complaint  Patient presents with  . Follow-up    follow up on asthma, also has a cyst on her right wrist, states she has problems with her wrists often, has had the cyst for a while but it has just now started bothering her    HPI:  Anna Webb is a 30 y.o. female who  has a past medical history of Anxiety; GERD (gastroesophageal reflux disease); Asthma; Chicken pox; Migraines; and Eczema. and presents today for a follow up office visit.   1.) Asthma - Currently maintained on albuterol and Advair. Takes the medication as prescribed and denies adverse side effects. Does not have to use her albuterol often. No night time awakenings or having to use oral corticosteroids.   2.) Wrist lesion - Associated symptom of a cyst located on her wrist has been going on for about 5 years with a waxing and waning. Describes worsening over the past couple of weeks with symptoms that are causing mild numbness and tingling. Modifying factors include naproxen which did not seem to help very much. No tenderness.   Allergies  Allergen Reactions  . Aspirin Nausea And Vomiting  . Penicillins Rash  . Tomato Rash     Current Outpatient Prescriptions on File Prior to Visit  Medication Sig Dispense Refill  . ALPRAZolam (XANAX) 0.5 MG tablet TAKE 1 TABLET BY MOUTH AT BEDTIME AS NEEDED 30 tablet 0  . [facility] GLYCERIN ADULT 2 G SUPP Place 2 g rectally daily as needed for moderate constipation.   2  . diphenhydrAMINE (BENADRYL) 25 MG tablet Take 25 mg by mouth every 6 (six) hours as needed for allergies.    Marland Kitchen levonorgestrel-ethinyl estradiol (AVIANE,ALESSE,LESSINA) 0.1-20 MG-MCG tablet Take 1 tablet by mouth daily.    Marland Kitchen loratadine (CLARITIN) 10 MG tablet Take 10 mg by mouth daily.    . pantoprazole (PROTONIX) 20 MG tablet Take 20 mg by mouth daily.     No current facility-administered medications on file prior to  visit.    Review of Systems  Constitutional: Negative for fever and chills.  Respiratory: Negative for chest tightness, shortness of breath and wheezing.   Cardiovascular: Negative for chest pain, palpitations and leg swelling.      Objective:    BP 110/80 mmHg  Pulse 65  Temp(Src) 98.3 F (36.8 C) (Oral)  Resp 18  Ht  (1.626 m)  Wt 164 lb (74.39 kg)  BMI 28.14 kg/m2  SpO2 99% Nursing note and vital signs reviewed.  Physical Exam  Constitutional: She is oriented to person, place, and time. She appears well-developed and well-nourished. No distress.  Cardiovascular: Normal rate, regular rhythm, normal heart sounds and intact distal pulses.   Pulmonary/Chest: Effort normal and breath sounds normal.  Musculoskeletal:  Right wrist - 2-4 cm, soft, mobile cyst located on the on the dorsal aspect of the hand on the lateral aspect next to the radial styloid process. No tenderness.   Neurological: She is alert and oriented to person, place, and time.  Skin: Skin is warm and dry.  Psychiatric: She has a normal mood and affect. Her behavior is normal. Judgment and thought content normal.       Assessment & Plan:   Problem List Items Addressed This Visit      Respiratory   Asthma    Mild persistent asthma well controlled with current  dosage of albuterol and Advair. No exacerbations recently. Continue current dosage of albuterol and Advair. Follow-up if symptoms worsen or no longer controlled.      Relevant Medications   Fluticasone-Salmeterol (ADVAIR DISKUS) 500-50 MCG/DOSE AEPB   albuterol (PROVENTIL HFA;VENTOLIN HFA) 108 (90 Base) MCG/ACT inhaler     Musculoskeletal and Integument   Ganglion cyst - Primary    Symptoms and exam consistent with ganglion cyst. Given location, refer to orthopedics for drainage or removal. Follow-up if symptoms worsen or fail to improve.      Relevant Orders   AMB referral to orthopedics

## 2015-04-20 NOTE — Assessment & Plan Note (Signed)
Mild persistent asthma well controlled with current dosage of albuterol and Advair. No exacerbations recently. Continue current dosage of albuterol and Advair. Follow-up if symptoms worsen or no longer controlled.

## 2015-04-20 NOTE — Patient Instructions (Signed)
Thank you for choosing [facility].  Summary/Instructions:  Continue to take your medication as prescribed.   Your prescription(s) have been submitted to your pharmacy or been printed and provided for you. Please take as directed and contact our office if you believe you are having problem(s) with the medication(s) or have any questions.  If your symptoms worsen or fail to improve, please contact our office for further instruction, or in case of emergency go directly to the emergency room at the closest medical facility.    Ganglion Cyst A ganglion cyst is a noncancerous, fluid-filled lump that occurs near joints or tendons. The ganglion cyst grows out of a joint or the lining of a tendon. It most often develops in the hand or wrist, but it can also develop in the shoulder, elbow, hip, knee, ankle, or foot. The round or oval ganglion cyst can be the size of a pea or larger than a grape. Increased activity may enlarge the size of the cyst because more fluid starts to build up.  CAUSES It is not known what causes a ganglion cyst to grow. However, it may be related to:  Inflammation or irritation around the joint.  An injury.  Repetitive movements or overuse.  Arthritis. RISK FACTORS Risk factors include:  Being a woman.  Being age 46-50. SIGNS AND SYMPTOMS Symptoms may include:   A lump. This most often appears on the hand or wrist, but it can occur in other areas of the body.  Tingling.  Pain.  Numbness.  Muscle weakness.  Weak grip.  Less movement in a joint. DIAGNOSIS Ganglion cysts are most often diagnosed based on a physical exam. Your health care provider will feel the lump and may shine a light alongside it. If it is a ganglion cyst, a light often shines through it. Your health care provider may order an X-ray, ultrasound, or MRI to rule out other conditions. TREATMENT Ganglion cysts usually go away on their own without treatment. If pain or other symptoms  are involved, treatment may be needed. Treatment is also needed if the ganglion cyst limits your movement or if it gets infected. Treatment may include:  Wearing a brace or splint on your wrist or finger.  Taking anti-inflammatory medicine.  Draining fluid from the lump with a needle (aspiration).  Injecting a steroid into the joint.  Surgery to remove the ganglion cyst. HOME CARE INSTRUCTIONS  Do not press on the ganglion cyst, poke it with a needle, or hit it.  Take medicines only as directed by your health care provider.  Wear your brace or splint as directed by your health care provider.  Watch your ganglion cyst for any changes.  Keep all follow-up visits as directed by your health care provider. This is important. SEEK MEDICAL CARE IF:  Your ganglion cyst becomes larger or more painful.  You have increased redness, red streaks, or swelling.  You have pus coming from the lump.  You have weakness or numbness in the affected area.  You have a fever or chills.   This information is not intended to replace advice given to you by your health care provider. Make sure you discuss any questions you have with your health care provider.   Document Released: 02/11/2000 Document Revised: 03/06/2014 Document Reviewed: 07/29/2013 Elsevier Interactive Patient Education [employer]

## 2015-05-05 ENCOUNTER — Telehealth: Payer: Self-pay

## 2015-05-05 DIAGNOSIS — J4530 Mild persistent asthma, uncomplicated: Secondary | ICD-10-CM

## 2015-05-05 NOTE — Telephone Encounter (Signed)
PA request received for Advair, covered alternatives are Arnuity, Flovent, Perforomist, and Qvar. Please advise on alternative medication, thanks!

## 2015-05-06 MED ORDER — FLUTICASONE FUROATE 100 MCG/ACT IN AEPB: 100.0000 ug | INHALATION_SPRAY | Freq: Every day | RESPIRATORY_TRACT | Status: DC

## 2015-05-06 NOTE — Telephone Encounter (Signed)
Arnuity sent to pharmacy. Thanks!

## 2015-05-13 ENCOUNTER — Other Ambulatory Visit: Payer: Self-pay | Admitting: Family

## 2015-05-13 NOTE — Telephone Encounter (Signed)
F[facility]ed [facility] to [facility].../lmb 

## 2015-06-24 ENCOUNTER — Other Ambulatory Visit: Payer: Self-pay | Admitting: Family

## 2015-06-24 NOTE — Telephone Encounter (Signed)
Last refill was 05/13/15 

## 2015-07-13 ENCOUNTER — Ambulatory Visit: Payer: BLUE CROSS/BLUE SHIELD | Admitting: Family

## 2015-07-13 DIAGNOSIS — Z0289 Encounter for other administrative examinations: Secondary | ICD-10-CM

## 2015-07-14 ENCOUNTER — Telehealth: Payer: Self-pay | Admitting: Family

## 2015-07-14 NOTE — Telephone Encounter (Signed)
Patient no showed for acute visit on 5/16.  Please advise.

## 2015-07-14 NOTE — Telephone Encounter (Signed)
Ok to reschedule if she calls back.  

## 2015-07-22 ENCOUNTER — Other Ambulatory Visit: Payer: Self-pay | Admitting: Family

## 2015-07-23 NOTE — Telephone Encounter (Signed)
Last refill was 06/24/15 

## 2015-07-25 ENCOUNTER — Other Ambulatory Visit: Payer: Self-pay | Admitting: Family

## 2015-09-23 ENCOUNTER — Other Ambulatory Visit: Payer: Self-pay | Admitting: Family

## 2015-09-24 NOTE — Telephone Encounter (Signed)
Last refill was 07/23/15.

## 2015-10-18 ENCOUNTER — Ambulatory Visit (INDEPENDENT_AMBULATORY_CARE_PROVIDER_SITE_OTHER): Payer: BLUE CROSS/BLUE SHIELD | Admitting: Family

## 2015-10-18 VITALS — BP 132/80 | HR 92 | Temp 98.0°F | Resp 16 | Ht 64.0 in | Wt 141.0 lb

## 2015-10-18 DIAGNOSIS — F418 Other specified anxiety disorders: Secondary | ICD-10-CM

## 2015-10-18 DIAGNOSIS — F329 Major depressive disorder, single episode, unspecified: Secondary | ICD-10-CM | POA: Insufficient documentation

## 2015-10-18 DIAGNOSIS — F419 Anxiety disorder, unspecified: Principal | ICD-10-CM

## 2015-10-18 DIAGNOSIS — F32A Depression, unspecified: Secondary | ICD-10-CM | POA: Insufficient documentation

## 2015-10-18 MED ORDER — ESCITALOPRAM OXALATE 10 MG PO TABS: 10.0000 mg | ORAL_TABLET | Freq: Every day | ORAL | 0 refills | Status: DC

## 2015-10-18 MED ORDER — ALPRAZOLAM 1 MG PO TABS: 0.5000 mg | ORAL_TABLET | Freq: Two times a day (BID) | ORAL | 0 refills | Status: DC | PRN

## 2015-10-18 NOTE — Patient Instructions (Signed)
Thank you for choosing [facility].  Summary/Instructions:  They will call to schedule your appointment with the counselors.  Start taking the Lexapro.  Continue to take the Xanax as needed for anxiety and panic attacks.   Your prescription(s) have been submitted to your pharmacy or been printed and provided for you. Please take as directed and contact our office if you believe you are having problem(s) with the medication(s) or have any questions.  If your symptoms worsen or fail to improve, please contact our office for further instruction, or in case of emergency go directly to the emergency room at the closest medical facility.   Major Depressive Disorder Major depressive disorder is a mental illness. It also may be called clinical depression or unipolar depression. Major depressive disorder usually causes feelings of sadness, hopelessness, or helplessness. Some people with this disorder do not feel particularly sad but lose interest in doing things they used to enjoy (anhedonia). Major depressive disorder also can cause physical symptoms. It can interfere with work, school, relationships, and other normal everyday activities. The disorder varies in severity but is longer lasting and more serious than the sadness we all feel from time to time in our lives. Major depressive disorder often is triggered by stressful life events or major life changes. Examples of these triggers include divorce, loss of your job or home, a move, and the death of a family member or close friend. Sometimes this disorder occurs for no obvious reason at all. People who have family members with major depressive disorder or bipolar disorder are at higher risk for developing this disorder, with or without life stressors. Major depressive disorder can occur at any age. It may occur just once in your life (single episode major depressive disorder). It may occur multiple times (recurrent major depressive  disorder). SYMPTOMS People with major depressive disorder have either anhedonia or depressed mood on nearly a daily basis for at least 2 weeks or longer. Symptoms of depressed mood include:  Feelings of sadness (blue or down in the dumps) or emptiness.  Feelings of hopelessness or helplessness.  Tearfulness or episodes of crying (may be observed by others).  Irritability (children and adolescents). In addition to depressed mood or anhedonia or both, people with this disorder have at least four of the following symptoms:  Difficulty sleeping or sleeping too much.   Significant change (increase or decrease) in appetite or weight.   Lack of energy or motivation.  Feelings of guilt and worthlessness.   Difficulty concentrating, remembering, or making decisions.  Unusually slow movement (psychomotor retardation) or restlessness (as observed by others).   Recurrent wishes for death, recurrent thoughts of self-harm (suicide), or a suicide attempt. People with major depressive disorder commonly have persistent negative thoughts about themselves, other people, and the world. People with severe major depressive disorder may experiencedistorted beliefs or perceptions about the world (psychotic delusions). They also may see or hear things that are not real (psychotic hallucinations). DIAGNOSIS Major depressive disorder is diagnosed through an assessment by your health care provider. Your health care provider will ask aboutaspects of your daily life, such as mood,sleep, and appetite, to see if you have the diagnostic symptoms of major depressive disorder. Your health care provider may ask about your medical history and use of alcohol or drugs, including prescription medicines. Your health care provider also may do a physical exam and blood work. This is because certain medical conditions and the use of certain substances can cause major depressive disorder-like symptoms (secondary  depression).  Your health care provider also may refer you to a mental health specialist for further evaluation and treatment. TREATMENT It is important to recognize the symptoms of major depressive disorder and seek treatment. The following treatments can be prescribed for this disorder:   Medicine. Antidepressant medicines usually are prescribed. Antidepressant medicines are thought to correct chemical imbalances in the brain that are commonly associated with major depressive disorder. Other types of medicine may be added if the symptoms do not respond to antidepressant medicines alone or if psychotic delusions or hallucinations occur.  Talk therapy. Talk therapy can be helpful in treating major depressive disorder by providing support, education, and guidance. Certain types of talk therapy also can help with negative thinking (cognitive behavioral therapy) and with relationship issues that trigger this disorder (interpersonal therapy). A mental health specialist can help determine which treatment is best for you. Most people with major depressive disorder do well with a combination of medicine and talk therapy. Treatments involving electrical stimulation of the brain can be used in situations with extremely severe symptoms or when medicine and talk therapy do not work over time. These treatments include electroconvulsive therapy, transcranial magnetic stimulation, and vagal nerve stimulation.   This information is not intended to replace advice given to you by your health care provider. Make sure you discuss any questions you have with your health care provider.   Document Released: 06/10/2012 Document Revised: 03/06/2014 Document Reviewed: 06/10/2012 Elsevier Interactive Patient Education 2016 [employer] and Stress Management Stress is a normal reaction to life events. It is what you feel when life demands more than you are used to or more than you can handle. Some stress can be useful. For  example, the stress reaction can help you catch the last bus of the day, study for a test, or meet a deadline at work. But stress that occurs too often or for too long can cause problems. It can affect your emotional health and interfere with relationships and normal daily activities. Too much stress can weaken your immune system and increase your risk for physical illness. If you already have a medical problem, stress can make it worse. CAUSES  All sorts of life events may cause stress. An event that causes stress for one person may not be stressful for another person. Major life events commonly cause stress. These may be positive or negative. Examples include losing your job, moving into a new home, getting married, having a baby, or losing a loved one. Less obvious life events may also cause stress, especially if they occur day after day or in combination. Examples include working long hours, driving in traffic, caring for children, being in debt, or being in a difficult relationship. SIGNS AND SYMPTOMS Stress may cause emotional symptoms including, the following:  Anxiety. This is feeling worried, afraid, on edge, overwhelmed, or out of control.  Anger. This is feeling irritated or impatient.  Depression. This is feeling sad, down, helpless, or guilty.  Difficulty focusing, remembering, or making decisions. Stress may cause physical symptoms, including the following:   Aches and pains. These may affect your head, neck, back, stomach, or other areas of your body.  Tight muscles or clenched jaw.  Low energy or trouble sleeping. Stress may cause unhealthy behaviors, including the following:   Eating to feel better (overeating) or skipping meals.  Sleeping too little, too much, or both.  Working too much or putting off tasks (procrastination).  Smoking, drinking alcohol, or using drugs to  feel better. DIAGNOSIS  Stress is diagnosed through an assessment by your health care provider.  Your health care provider will ask questions about your symptoms and any stressful life events.Your health care provider will also ask about your medical history and may order blood tests or other tests. Certain medical conditions and medicine can cause physical symptoms similar to stress. Mental illness can cause emotional symptoms and unhealthy behaviors similar to stress. Your health care provider may refer you to a mental health professional for further evaluation.  TREATMENT  Stress management is the recommended treatment for stress.The goals of stress management are reducing stressful life events and coping with stress in healthy ways.  Techniques for reducing stressful life events include the following:  Stress identification. Self-monitor for stress and identify what causes stress for you. These skills may help you to avoid some stressful events.  Time management. Set your priorities, keep a calendar of events, and learn to say "no." These tools can help you avoid making too many commitments. Techniques for coping with stress include the following:  Rethinking the problem. Try to think realistically about stressful events rather than ignoring them or overreacting. Try to find the positives in a stressful situation rather than focusing on the negatives.  Exercise. Physical exercise can release both physical and emotional tension. The key is to find a form of exercise you enjoy and do it regularly.  Relaxation techniques. These relax the body and mind. Examples include yoga, meditation, tai chi, biofeedback, deep breathing, progressive muscle relaxation, listening to music, being out in nature, journaling, and other hobbies. Again, the key is to find one or more that you enjoy and can do regularly.  Healthy lifestyle. Eat a balanced diet, get plenty of sleep, and do not smoke. Avoid using alcohol or drugs to relax.  Strong support network. Spend time with family, friends, or other people  you enjoy being around.Express your feelings and talk things over with someone you trust. Counseling or talktherapy with a mental health professional may be helpful if you are having difficulty managing stress on your own. Medicine is typically not recommended for the treatment of stress.Talk to your health care provider if you think you need medicine for symptoms of stress. HOME CARE INSTRUCTIONS  Keep all follow-up visits as directed by your health care provider.  Take all medicines as directed by your health care provider. SEEK MEDICAL CARE IF:  Your symptoms get worse or you start having new symptoms.  You feel overwhelmed by your problems and can no longer manage them on your own. SEEK IMMEDIATE MEDICAL CARE IF:  You feel like hurting yourself or someone else.   This information is not intended to replace advice given to you by your health care provider. Make sure you discuss any questions you have with your health care provider.   Document Released: 08/09/2000 Document Revised: 03/06/2014 Document Reviewed: 10/08/2012 Elsevier Interactive Patient Education [employer].

## 2015-10-18 NOTE — Assessment & Plan Note (Signed)
Symptoms and exam consistent with anxiety and depression and multiple panic attacks with current regimen secondary to family, work, and personal life stressors. Denies suicidal ideations. Increase alprazolam. Start Lexapro. Referral to counseling place with patient agreement with plan. Encouraged to find employee assistance program. Reviewed medication usage and associated side effects. Follow-up in one month or sooner if needed.

## 2015-10-18 NOTE — Progress Notes (Signed)
Subjective:    Patient ID: Norm ParcelAlisha S Mella, female    DOB: April 17, 1985, 30 y.o.   MRN: [id number]  Chief Complaint  Patient presents with  . Panic Attack    has been having panic attacks more lately, states she had 3 panic attacks at work in one day and they sent her home, has been having shakes and headaches and has been stressed    HPI:  Norm Parcellisha S Boak is a 30 y.o. female who  has a past medical history of Anxiety; Asthma; Chicken pox; Eczema; GERD (gastroesophageal reflux disease); and Migraines. and presents today for an office visit.   Anxiety - Currently maintained on alprazolam. Takes the medication as prescribed and denies adverse side effects. Notes her symptoms have been labile recently with multiple panic attacks and has been sent home from work recently because of them. Notes significantly increased stress level. Describes building multiple stressors between life, family and work. Notes her mind to feel foggy. Has a decreased appetite and not sleeping. No thoughts of suicide. She has even doubled her dose which has not helped very much. Mood remains labile. Had her grandmother pass in November of 2016.    Allergies  Allergen Reactions  . Aspirin Nausea And Vomiting  . Penicillins Rash  . Tomato Rash     Current Outpatient Prescriptions on File Prior to Visit  Medication Sig Dispense Refill  . albuterol (PROVENTIL HFA;VENTOLIN HFA) 108 (90 Base) MCG/ACT inhaler Inhale 2 puffs into the lungs every 6 (six) hours as needed for wheezing or shortness of breath. 1 Inhaler 2  . [employer] GLYCERIN ADULT 2 G SUPP Place 2 g rectally daily as needed for moderate constipation.   2  . diphenhydrAMINE (BENADRYL) 25 MG tablet Take 25 mg by mouth every 6 (six) hours as needed for allergies.    . Fluticasone Furoate (ARNUITY ELLIPTA) 100 MCG/ACT AEPB Inhale 100 mcg into the lungs daily. 30 each 5  . Fluticasone-Salmeterol (ADVAIR DISKUS) 500-50 MCG/DOSE AEPB Inhale 1 puff into the lungs 2 (two)  times daily. 180 each 2  . levonorgestrel-ethinyl estradiol (AVIANE,ALESSE,LESSINA) 0.1-20 MG-MCG tablet Take 1 tablet by mouth daily.    Marland Kitchen. loratadine (CLARITIN) 10 MG tablet Take 10 mg by mouth daily.    . pantoprazole (PROTONIX) 20 MG tablet Take 20 mg by mouth daily.     No current facility-administered medications on file prior to visit.     Review of Systems  Constitutional: Negative for chills and fever.  Respiratory: Negative for chest tightness, shortness of breath and wheezing.   Psychiatric/Behavioral: Positive for dysphoric mood and sleep disturbance. Negative for suicidal ideas. The patient is nervous/anxious.       Objective:    BP 132/80 (BP Location: Left Arm, Patient Position: Sitting, Cuff Size: Normal)   Pulse 92   Temp 98 F (36.7 C) (Oral)   Resp 16   Ht 5\' 4"  (1.626 m)   Wt 141 lb (64 kg)   SpO2 99%   BMI 24.20 kg/m  Nursing note and vital signs reviewed.  Physical Exam  Constitutional: She is oriented to person, place, and time. She appears well-developed and well-nourished. No distress.  Cardiovascular: Normal rate, regular rhythm, normal heart sounds and intact distal pulses.   Pulmonary/Chest: Effort normal and breath sounds normal.  Neurological: She is alert and oriented to person, place, and time.  Skin: Skin is warm and dry.  Psychiatric: Her speech is normal and behavior is normal. Judgment and thought content normal.  Her mood appears anxious. She exhibits a depressed mood.       Assessment & Plan:   Problem List Items Addressed This Visit      Other   Anxiety and depression - Primary    Symptoms and exam consistent with anxiety and depression and multiple panic attacks with current regimen secondary to family, work, and personal life stressors. Denies suicidal ideations. Increase alprazolam. Start Lexapro. Referral to counseling place with patient agreement with plan. Encouraged to find employee assistance program. Reviewed medication usage  and associated side effects. Follow-up in one month or sooner if needed.      Relevant Medications   escitalopram (LEXAPRO) 10 MG tablet   ALPRAZolam (XANAX) 1 MG tablet    Other Visit Diagnoses   None.      I have discontinued Ms. Newberry's ALPRAZolam. I am also having her start on escitalopram and ALPRAZolam. Additionally, I am having her maintain her pantoprazole, levonorgestrel-ethinyl estradiol, CVS GLYCERIN ADULT, loratadine, diphenhydrAMINE, Fluticasone-Salmeterol, albuterol, and Fluticasone Furoate.   Meds ordered this encounter  Medications  . escitalopram (LEXAPRO) 10 MG tablet    Sig: Take 1 tablet (10 mg total) by mouth daily.    Dispense:  30 tablet    Refill:  0    Order Specific Question:   Supervising Provider    Answer:   Hillard DankerRAWFORD, ELIZABETH A [[id number]]  . ALPRAZolam (XANAX) 1 MG tablet    Sig: Take 0.5-1 tablets (0.5-1 mg total) by mouth 2 (two) times daily as needed for anxiety.    Dispense:  60 tablet    Refill:  0    Order Specific Question:   Supervising Provider    Answer:   Hillard DankerRAWFORD, ELIZABETH A [[id number]]     Follow-up: Return in about 1 month (around 11/18/2015), or if symptoms worsen or fail to improve.  Jeanine Luzalone, Gregory, FNP

## 2015-11-16 ENCOUNTER — Encounter: Payer: Self-pay | Admitting: Family

## 2015-11-16 ENCOUNTER — Ambulatory Visit (INDEPENDENT_AMBULATORY_CARE_PROVIDER_SITE_OTHER): Payer: BLUE CROSS/BLUE SHIELD | Admitting: Family

## 2015-11-16 DIAGNOSIS — F419 Anxiety disorder, unspecified: Principal | ICD-10-CM

## 2015-11-16 DIAGNOSIS — F418 Other specified anxiety disorders: Secondary | ICD-10-CM

## 2015-11-16 DIAGNOSIS — F329 Major depressive disorder, single episode, unspecified: Secondary | ICD-10-CM

## 2015-11-16 MED ORDER — ESCITALOPRAM OXALATE 20 MG PO TABS: 20.0000 mg | ORAL_TABLET | Freq: Every day | ORAL | 0 refills | Status: DC

## 2015-11-16 MED ORDER — ALPRAZOLAM 1 MG PO TABS: 0.5000 mg | ORAL_TABLET | Freq: Two times a day (BID) | ORAL | 0 refills | Status: DC | PRN

## 2015-11-16 NOTE — Progress Notes (Signed)
Subjective:    Patient ID: Anna Webb, female    DOB: [birth year], 30 y.o.   MRN: [id number]  Chief Complaint  Patient presents with  . Med follow up    medication changes are not helping stress or anxiety, very foggy minded, almost tempted to quit job due to anxiety, very emotional, depression    HPI:  Anna Webb is a 30 y.o. female who  has a past medical history of Anxiety; Asthma; Chicken pox; Eczema; GERD (gastroesophageal reflux disease); and Migraines. and presents today for a follow up office visit.  1.) Anxiety and depression - Previously started on Lexapro and increased alprazolam. Reports taking the medication as prescribed and denies adverse side effects. Notes that her symptoms remain labile and she continues to experience symptoms of depression, being emotional and the severity has her tempted to quit her job secondary to the symptoms. Denies suicidal ideations. Notes that she has good days and she has bad days. Seems like she is having more bad days than good days.    Allergies  Allergen Reactions  . Aspirin Nausea And Vomiting  . Penicillins Rash  . Tomato Rash      Outpatient Medications Prior to Visit  Medication Sig Dispense Refill  . albuterol (PROVENTIL HFA;VENTOLIN HFA) 108 (90 Base) MCG/ACT inhaler Inhale 2 puffs into the lungs every 6 (six) hours as needed for wheezing or shortness of breath. 1 Inhaler 2  . [facility]S GLYCERIN ADULT 2 G SUPP Place 2 g rectally daily as needed for moderate constipation.   2  . diphenhydrAMINE (BENADRYL) 25 MG tablet Take 25 mg by mouth every 6 (six) hours as needed for allergies.    . Fluticasone Furoate (ARNUITY ELLIPTA) 100 MCG/ACT AEPB Inhale 100 mcg into the lungs daily. 30 each 5  . Fluticasone-Salmeterol (ADVAIR DISKUS) 500-50 MCG/DOSE AEPB Inhale 1 puff into the lungs 2 (two) times daily. 180 each 2  . levonorgestrel-ethinyl estradiol (AVIANE,ALESSE,LESSINA) 0.1-20 MG-MCG tablet Take 1 tablet by mouth daily.    Marland Kitchen  loratadine (CLARITIN) 10 MG tablet Take 10 mg by mouth daily.    . pantoprazole (PROTONIX) 20 MG tablet Take 20 mg by mouth daily.    Marland Kitchen ALPRAZolam (XANAX) 1 MG tablet Take 0.5-1 tablets (0.5-1 mg total) by mouth 2 (two) times daily as needed for anxiety. 60 tablet 0  . escitalopram (LEXAPRO) 10 MG tablet Take 1 tablet (10 mg total) by mouth daily. 30 tablet 0   No facility-administered medications prior to visit.      Review of Systems  Constitutional: Negative for chills and fever.  Psychiatric/Behavioral: Positive for dysphoric mood. Negative for suicidal ideas. The patient is nervous/anxious.       Objective:    BP 100/68 (BP Location: Left Arm, Patient Position: Sitting, Cuff Size: Normal)   Pulse 66   Temp 98.8 F (37.1 C) (Oral)   Resp 16   Ht 5\' 4"  (1.626 m)   Wt 139 lb (63 kg)   SpO2 98%   BMI 23.86 kg/m  Nursing note and vital signs reviewed.  Physical Exam  Constitutional: She is oriented to person, place, and time. She appears well-developed and well-nourished. No distress.  Cardiovascular: Normal rate, regular rhythm, normal heart sounds and intact distal pulses.   Pulmonary/Chest: Effort normal and breath sounds normal.  Neurological: She is alert and oriented to person, place, and time.  Skin: Skin is warm and dry.  Psychiatric: Her behavior is normal. Judgment and thought content normal. Her  mood appears anxious. She exhibits a depressed mood.       Assessment & Plan:   Problem List Items Addressed This Visit      Other   Anxiety and depression    Symptoms are consistent with continued exacerbation of her depression that is affecting her work and her day to day life. Lexapro with minor improvements. Increase Lexapro. Continue current dosage of alprazolam. Discussed community resources as needed. Patient is discussing possibility of short term disability/FMLA as her symptoms are currently interfering with her ability to successfully perform her job functions.        Relevant Medications   escitalopram (LEXAPRO) 20 MG tablet   ALPRAZolam (XANAX) 1 MG tablet    Other Visit Diagnoses   None.      I have changed Ms. Binegar's escitalopram. I am also having her maintain her pantoprazole, levonorgestrel-ethinyl estradiol, CVS GLYCERIN ADULT, loratadine, diphenhydrAMINE, Fluticasone-Salmeterol, albuterol, Fluticasone Furoate, and ALPRAZolam.   Meds ordered this encounter  Medications  . escitalopram (LEXAPRO) 20 MG tablet    Sig: Take 1 tablet (20 mg total) by mouth daily.    Dispense:  30 tablet    Refill:  0    Order Specific Question:   Supervising Provider    Answer:   Hillard DankerRAWFORD, ELIZABETH A [[id number]]  . ALPRAZolam (XANAX) 1 MG tablet    Sig: Take 0.5-1 tablets (0.5-1 mg total) by mouth 2 (two) times daily as needed for anxiety.    Dispense:  60 tablet    Refill:  0    Order Specific Question:   Supervising Provider    Answer:   Hillard DankerRAWFORD, ELIZABETH A [[id number]]     Follow-up: Return in about 2 weeks (around 11/30/2015), or if symptoms worsen or fail to improve.  Jeanine Luzalone, Alexys Lobello, FNP

## 2015-11-16 NOTE — Patient Instructions (Addendum)
Thank you for choosing [facility].  SUMMARY AND INSTRUCTIONS:  If your symptoms do not improve and you are continuing to experience symptoms despite the medication changes recommend follow up with [facility] at [address], [place], [place] [address] Hours: Open today  8:30AM-5PM Phone: [phone]  Medication:  Please increase your Lexapro to 20 mg daily.  Please continue to take the alprazolam.   Your prescription(s) have been submitted to your pharmacy or been printed and provided for you. Please take as directed and contact our office if you believe you are having problem(s) with the medication(s) or have any questions.  Follow up:  If your symptoms worsen or fail to improve, please contact our office for further instruction, or in case of emergency go directly to the emergency room at the closest medical facility.

## 2015-11-16 NOTE — Assessment & Plan Note (Signed)
Symptoms are consistent with continued exacerbation of her depression that is affecting her work and her day to day life. Lexapro with minor improvements. Increase Lexapro. Continue current dosage of alprazolam. Discussed community resources as needed. Patient is discussing possibility of short term disability/FMLA as her symptoms are currently interfering with her ability to successfully perform her job functions.

## 2015-11-26 ENCOUNTER — Encounter: Payer: Self-pay | Admitting: Family

## 2015-11-26 ENCOUNTER — Ambulatory Visit (INDEPENDENT_AMBULATORY_CARE_PROVIDER_SITE_OTHER): Payer: BLUE CROSS/BLUE SHIELD | Admitting: Family

## 2015-11-26 DIAGNOSIS — F322 Major depressive disorder, single episode, severe without psychotic features: Secondary | ICD-10-CM | POA: Diagnosis not present

## 2015-11-26 MED ORDER — LORATADINE 10 MG PO TABS: 10.0000 mg | ORAL_TABLET | Freq: Every day | ORAL | 1 refills | Status: DC

## 2015-11-26 MED ORDER — OMEPRAZOLE 40 MG PO CPDR
40.0000 mg | DELAYED_RELEASE_CAPSULE | Freq: Every day | ORAL | 1 refills | Status: DC
Start: 2015-11-26 — End: 2016-01-13

## 2015-11-26 NOTE — Progress Notes (Signed)
Subjective:    Patient ID: Anna Webb, female    DOB: Nov 18, 1985, 30 y.o.   MRN: [id number]  Chief Complaint  Patient presents with  . Follow-up    was pulled to the side yesterday by manager yesterday and was told that she needs to do FMLA has papers, wants to get admitted but not having sucidal thoughts, foggy minded    HPI:  Anna Webb is a 30 y.o. female who  has a past medical history of Anxiety; Asthma; Chicken pox; Eczema; GERD (gastroesophageal reflux disease); and Migraines. and presents today for a follow up office visit.   Continues to experience the associated symptoms of depression. Maintained on Lexapro and reports taking the medication as prescribed and denies adverse side effects. Symptoms have continued to progressively worsen and her symptoms are effecting her work and personal relationships. Describes that she is not eating very well and maintains in a fog and groggy state. Denies suicidal ideations. She is not performing her job like she needs to to. Her employer has recommend FMLA. Fiance has noted that she is sleep talking as well.   Allergies  Allergen Reactions  . Aspirin Nausea And Vomiting  . Penicillins Rash  . Tomato Rash      Outpatient Medications Prior to Visit  Medication Sig Dispense Refill  . albuterol (PROVENTIL HFA;VENTOLIN HFA) 108 (90 Base) MCG/ACT inhaler Inhale 2 puffs into the lungs every 6 (six) hours as needed for wheezing or shortness of breath. 1 Inhaler 2  . ALPRAZolam (XANAX) 1 MG tablet Take 0.5-1 tablets (0.5-1 mg total) by mouth 2 (two) times daily as needed for anxiety. 60 tablet 0  . [facility] GLYCERIN ADULT 2 G SUPP Place 2 g rectally daily as needed for moderate constipation.   2  . diphenhydrAMINE (BENADRYL) 25 MG tablet Take 25 mg by mouth every 6 (six) hours as needed for allergies.    Marland Kitchen escitalopram (LEXAPRO) 20 MG tablet Take 1 tablet (20 mg total) by mouth daily. 30 tablet 0  . Fluticasone Furoate (ARNUITY ELLIPTA) 100  MCG/ACT AEPB Inhale 100 mcg into the lungs daily. 30 each 5  . Fluticasone-Salmeterol (ADVAIR DISKUS) 500-50 MCG/DOSE AEPB Inhale 1 puff into the lungs 2 (two) times daily. 180 each 2  . levonorgestrel-ethinyl estradiol (AVIANE,ALESSE,LESSINA) 0.1-20 MG-MCG tablet Take 1 tablet by mouth daily.    Marland Kitchen loratadine (CLARITIN) 10 MG tablet Take 10 mg by mouth daily.    . pantoprazole (PROTONIX) 20 MG tablet Take 20 mg by mouth daily.     No facility-administered medications prior to visit.     Review of Systems  Constitutional: Negative for chills and fever.  Respiratory: Negative for chest tightness and shortness of breath.   Cardiovascular: Negative for chest pain, palpitations and leg swelling.  Psychiatric/Behavioral: Positive for decreased concentration, dysphoric mood and sleep disturbance. Negative for suicidal ideas. The patient is nervous/anxious.       Objective:    BP 104/68 (BP Location: Left Arm, Patient Position: Sitting, Cuff Size: Normal)   Pulse 75   Temp 97.8 F (36.6 C) (Oral)   Resp 16   Ht 5\' 4"  (1.626 m)   Wt 132 lb 12.8 oz (60.2 kg)   SpO2 99%   BMI 22.80 kg/m  Nursing note and vital signs reviewed.  Physical Exam  Constitutional: She is oriented to person, place, and time. She appears well-developed and well-nourished. No distress.  Cardiovascular: Normal rate, regular rhythm, normal heart sounds and intact distal pulses.  Pulmonary/Chest: Effort normal and breath sounds normal.  Neurological: She is alert and oriented to person, place, and time.  Skin: Skin is warm and dry.  Psychiatric: Her speech is normal. Judgment and thought content normal. Her mood appears anxious. She is withdrawn. Cognition and memory are normal. She exhibits a depressed mood. She expresses no homicidal and no suicidal ideation.       Assessment & Plan:   Problem List Items Addressed This Visit      Other   Severe major depression (HCC)    Symptoms are consistent with severe  major depression refractory to Lexapro. Denies suicidal ideations. Recommend follow-up with psychiatry or seek emergency care if symptoms of suicide develop prior to access to psychiatry. Continue current dosage of Lexapro. Resources and information provided. FMLA papers completed to best ability for current situation.       Other Visit Diagnoses   None.      I have discontinued Ms. Mcgillicuddy's pantoprazole. I have also changed her loratadine. Additionally, I am having her start on omeprazole. Lastly, I am having her maintain her levonorgestrel-ethinyl estradiol, CVS GLYCERIN ADULT, diphenhydrAMINE, Fluticasone-Salmeterol, albuterol, Fluticasone Furoate, escitalopram, and ALPRAZolam.   Meds ordered this encounter  Medications  . loratadine (CLARITIN) 10 MG tablet    Sig: Take 1 tablet (10 mg total) by mouth daily.    Dispense:  90 tablet    Refill:  1  . omeprazole (PRILOSEC) 40 MG capsule    Sig: Take 1 capsule (40 mg total) by mouth daily.    Dispense:  90 capsule    Refill:  1    Order Specific Question:   Supervising Provider    Answer:   Hillard DankerRAWFORD, ELIZABETH A [[id number]]     Follow-up: Return if symptoms worsen or fail to improve.  Jeanine Luzalone, Laykin Rainone, FNP

## 2015-11-26 NOTE — Patient Instructions (Addendum)
Thank you for choosing [facility].  SUMMARY AND INSTRUCTIONS:  If your symptoms do not improve and you are continuing to experience symptoms despite the medication changes recommend follow up with   [facility] at [address], [place], [place] [address] Hours: Open today  8:30AM-5PM Phone: [phone]    Follow up:  If your symptoms worsen or fail to improve, please contact our office for further instruction, or in case of emergency go directly to the emergency room at the closest medical facility.

## 2015-11-26 NOTE — Assessment & Plan Note (Signed)
Symptoms are consistent with severe major depression refractory to Lexapro. Denies suicidal ideations. Recommend follow-up with psychiatry or seek emergency care if symptoms of suicide develop prior to access to psychiatry. Continue current dosage of Lexapro. Resources and information provided. FMLA papers completed to best ability for current situation.

## 2015-12-07 ENCOUNTER — Ambulatory Visit: Payer: BLUE CROSS/BLUE SHIELD | Admitting: Family

## 2015-12-07 ENCOUNTER — Telehealth: Payer: Self-pay | Admitting: Family

## 2015-12-07 NOTE — Telephone Encounter (Signed)
Patient no showed for 2 week follow up on 10/10.  Please advise.

## 2015-12-07 NOTE — Telephone Encounter (Signed)
Ok to reschedule if she calls back.  

## 2015-12-21 ENCOUNTER — Other Ambulatory Visit: Payer: Self-pay | Admitting: Family

## 2015-12-21 DIAGNOSIS — F419 Anxiety disorder, unspecified: Principal | ICD-10-CM

## 2015-12-21 DIAGNOSIS — F329 Major depressive disorder, single episode, unspecified: Secondary | ICD-10-CM

## 2015-12-21 NOTE — Telephone Encounter (Signed)
Last refill was 11/16/15

## 2016-01-13 ENCOUNTER — Other Ambulatory Visit: Payer: Self-pay

## 2016-01-13 MED ORDER — OMEPRAZOLE 40 MG PO CPDR: 40.0000 mg | DELAYED_RELEASE_CAPSULE | Freq: Every day | ORAL | 1 refills | Status: DC

## 2016-02-14 ENCOUNTER — Other Ambulatory Visit: Payer: Self-pay | Admitting: Family

## 2016-02-14 DIAGNOSIS — F419 Anxiety disorder, unspecified: Principal | ICD-10-CM

## 2016-02-14 DIAGNOSIS — F32A Depression, unspecified: Secondary | ICD-10-CM

## 2016-02-14 DIAGNOSIS — F329 Major depressive disorder, single episode, unspecified: Secondary | ICD-10-CM

## 2016-02-15 NOTE — Telephone Encounter (Signed)
Last refill was 12/22/15

## 2016-02-16 NOTE — Telephone Encounter (Signed)
Faxed

## 2016-04-07 ENCOUNTER — Other Ambulatory Visit (INDEPENDENT_AMBULATORY_CARE_PROVIDER_SITE_OTHER): Payer: BLUE CROSS/BLUE SHIELD

## 2016-04-07 ENCOUNTER — Ambulatory Visit (INDEPENDENT_AMBULATORY_CARE_PROVIDER_SITE_OTHER): Payer: BLUE CROSS/BLUE SHIELD | Admitting: Family

## 2016-04-07 ENCOUNTER — Encounter: Payer: Self-pay | Admitting: Family

## 2016-04-07 VITALS — BP 118/80 | HR 90 | Temp 98.1°F | Resp 16 | Ht 64.0 in | Wt 157.0 lb

## 2016-04-07 DIAGNOSIS — J302 Other seasonal allergic rhinitis: Secondary | ICD-10-CM | POA: Insufficient documentation

## 2016-04-07 DIAGNOSIS — Z0001 Encounter for general adult medical examination with abnormal findings: Secondary | ICD-10-CM

## 2016-04-07 LAB — COMPREHENSIVE METABOLIC PANEL
ALT: 113 U/L — ABNORMAL HIGH (ref 0–35)
AST: 65 U/L — ABNORMAL HIGH (ref 0–37)
Albumin: 3.9 g/dL (ref 3.5–5.2)
Alkaline Phosphatase: 60 U/L (ref 39–117)
BUN: 11 mg/dL (ref 6–23)
CALCIUM: 9 mg/dL (ref 8.4–10.5)
CO2: 28 meq/L (ref 19–32)
Chloride: 110 mEq/L (ref 96–112)
Creatinine, Ser: 0.84 mg/dL (ref 0.40–1.20)
GFR: 102.52 mL/min (ref >60.00)
GLUCOSE: 90 mg/dL (ref 70–99)
POTASSIUM: 4.5 meq/L (ref 3.5–5.1)
Sodium: 140 mEq/L (ref 135–145)
Total Bilirubin: 0.2 mg/dL (ref 0.2–1.2)
Total Protein: 6.3 g/dL (ref 6.0–8.3)

## 2016-04-07 LAB — CBC
HEMATOCRIT: 38.3 % (ref 36.0–46.0)
HEMOGLOBIN: 12.9 g/dL (ref 12.0–15.0)
MCHC: 33.8 g/dL (ref 30.0–36.0)
MCV: 89.9 fl (ref 78.0–100.0)
Platelets: 240 10*3/uL (ref 150.0–400.0)
RBC: 4.26 Mil/uL (ref 3.87–5.11)
RDW: 13 % (ref 11.5–15.5)
WBC: 7.8 10*3/uL (ref 4.0–10.5)

## 2016-04-07 LAB — LIPID PANEL
Cholesterol: 170 mg/dL (ref 0–200)
HDL: 49.2 mg/dL (ref >39.00)
NONHDL: 121.29
TRIGLYCERIDES: 227 mg/dL — AB (ref 0.0–149.0)
Total CHOL/HDL Ratio: 3
VLDL: 45.4 mg/dL — AB (ref 0.0–40.0)

## 2016-04-07 LAB — VITAMIN D 25 HYDROXY (VIT D DEFICIENCY, FRACTURES): VITD: 8.24 ng/mL — AB (ref 30.00–100.00)

## 2016-04-07 LAB — LDL CHOLESTEROL, DIRECT: Direct LDL: 93 mg/dL

## 2016-04-07 MED ORDER — LORATADINE 10 MG PO TABS: 10.0000 mg | ORAL_TABLET | Freq: Every day | ORAL | 1 refills | Status: DC

## 2016-04-07 MED ORDER — LEVONORGESTREL-ETHINYL ESTRAD 0.1-20 MG-MCG PO TABS: 1.0000 | ORAL_TABLET | Freq: Every day | ORAL | 11 refills | Status: DC

## 2016-04-07 MED ORDER — MONTELUKAST SODIUM 10 MG PO TABS: 10.0000 mg | ORAL_TABLET | Freq: Every day | ORAL | 3 refills | Status: DC

## 2016-04-07 NOTE — Assessment & Plan Note (Signed)
1) Anticipatory Guidance: Discussed importance of wearing a seatbelt while driving and not texting while driving; changing batteries in smoke detector at least once annually; wearing suntan lotion when outside; eating a balanced and moderate diet; getting physical activity at least 30 minutes per day.  2) Immunizations / Screenings / Labs:  Declines tetanus and influenza. All other immunizations are up to date per recommendations. Due for a dental and vision exam encouraged to be completed independently. Cervical cancer screening will be due in September 2018. Obtain Vitamin D for Vitamin D deficiency screening. All other screenings are up to date per recommendations. Obtain CBC, CMET, and lipid profile.    Overall well exam with risk factors for cardiovascular disease being minimal at this time. She does continue to struggle with depression, however is slowly improving and has returned to work with some continued difficulty. She continues to work with psychiatry. BMI slightly overwieght with recommendation for increasing physical activity and modifying nutrition. Continue other healthy lifestyle behaviors and choices. Follow up prevention exam in 1 year. Follow up office visit pending blood work and for chronic conditions.

## 2016-04-07 NOTE — Progress Notes (Signed)
Subjective:    Patient ID: Anna Webb, female    DOB: [birth year], 31 y.o.   MRN: [id number]  Chief Complaint  Patient presents with  . CPE    fasting    HPI:  Anna Webb is a 31 y.o. female who presents today for an annual wellness visit.   1) Health Maintenance -   Diet - Averages about 2-3 meals per day consisting of a regular diet; Denies any caffeine intake  Exercise - No structured exercise   2) Preventative Exams / Immunizations:  Dental -- Due for exam.   Vision -- Due for exam.    Health Maintenance  Topic Date Due  . TETANUS/TDAP  06/20/2005  . INFLUENZA VACCINE  05/27/2016 (Originally 09/28/2015)  . PAP SMEAR  02/04/2017  . HIV Screening  Completed     There is no immunization history on file for this patient.   Allergies  Allergen Reactions  . Aspirin Nausea And Vomiting  . Penicillins Rash  . Tomato Rash     Outpatient Medications Prior to Visit  Medication Sig Dispense Refill  . albuterol (PROVENTIL HFA;VENTOLIN HFA) 108 (90 Base) MCG/ACT inhaler Inhale 2 puffs into the lungs every 6 (six) hours as needed for wheezing or shortness of breath. 1 Inhaler 2  . CVS GLYCERIN ADULT 2 G SUPP Place 2 g rectally daily as needed for moderate constipation.   2  . Fluticasone Furoate (ARNUITY ELLIPTA) 100 MCG/ACT AEPB Inhale 100 mcg into the lungs daily. 30 each 5  . Fluticasone-Salmeterol (ADVAIR DISKUS) 500-50 MCG/DOSE AEPB Inhale 1 puff into the lungs 2 (two) times daily. 180 each 2  . omeprazole (PRILOSEC) 40 MG capsule Take 1 capsule (40 mg total) by mouth daily. 90 capsule 1  . levonorgestrel-ethinyl estradiol (AVIANE,ALESSE,LESSINA) 0.1-20 MG-MCG tablet Take 1 tablet by mouth daily.    Marland Kitchen loratadine (CLARITIN) 10 MG tablet Take 1 tablet (10 mg total) by mouth daily. 90 tablet 1  . ALPRAZolam (XANAX) 1 MG tablet TAKE 1/2 TO 1 TABLET BY MOUTH TWICE A DAY AS NEEDED FOR ANXIETY 60 tablet 0  . diphenhydrAMINE (BENADRYL) 25 MG tablet Take 25 mg by  mouth every 6 (six) hours as needed for allergies.    Marland Kitchen escitalopram (LEXAPRO) 20 MG tablet Take 1 tablet (20 mg total) by mouth daily. 30 tablet 0   No facility-administered medications prior to visit.      Past Medical History:  Diagnosis Date  . Anxiety   . Asthma   . Chicken pox   . Eczema   . GERD (gastroesophageal reflux disease)   . Migraines      No past surgical history on file.   Family History  Problem Relation Age of Onset  . Hyperlipidemia Mother   . Hypertension Mother   . Drug abuse Father   . Diabetes Father   . Arthritis Maternal Grandmother   . Lung cancer Maternal Grandmother   . Prostate cancer Maternal Grandfather      Social History   Social History  . Marital status: Single    Spouse name: N/A  . Number of children: 0  . Years of education: 12   Occupational History  . Editor, commissioning    Social History Main Topics  . Smoking status: Current Every Day Smoker    Packs/day: 0.25    Years: 6.00    Types: Cigarettes  . Smokeless tobacco: Never Used  . Alcohol use Yes     Comment: occasionally  .  Drug use: No  . Sexual activity: Yes    Birth control/ protection: Pill   Other Topics Concern  . Not on file   Social History Narrative  . No narrative on file      Review of Systems  Constitutional: Denies fever, chills, fatigue, or significant weight gain/loss. HENT: Head: Denies headache or neck pain Ears: Denies changes in hearing, ringing in ears, earache, drainage Nose: Denies discharge, stuffiness, itching, nosebleed, sinus pain Throat: Denies sore throat, hoarseness, dry mouth, sores, thrush Eyes: Denies loss/changes in vision, pain, redness, blurry/double vision, flashing lights Cardiovascular: Denies chest pain/discomfort, tightness, palpitations, shortness of breath with activity, difficulty lying down, swelling, sudden awakening with shortness of breath Respiratory: Denies shortness of breath, cough, sputum production,  wheezing Gastrointestinal: Denies dysphasia, heartburn, change in appetite, nausea, change in bowel habits, rectal bleeding, constipation, diarrhea, yellow skin or eyes Genitourinary: Denies frequency, urgency, burning/pain, blood in urine, incontinence, change in urinary strength. Musculoskeletal: Denies muscle/joint pain, stiffness, back pain, redness or swelling of joints, trauma Skin: Denies rashes, lumps, itching, dryness, color changes, or hair/nail changes Neurological: Denies dizziness, fainting, seizures, weakness, numbness, tingling, tremor Psychiatric - Denies nervousness, stress, depression or memory loss Endocrine: Denies heat or cold intolerance, sweating, frequent urination, excessive thirst, changes in appetite Hematologic: Denies ease of bruising or bleeding     Objective:     BP 118/80 (BP Location: Left Arm, Patient Position: Sitting, Cuff Size: Normal)   Pulse 90   Temp 98.1 F (36.7 C) (Oral)   Resp 16   Ht 5\' 4"  (1.626 m)   Wt 157 lb (71.2 kg)   SpO2 98%   BMI 26.95 kg/m  Nursing note and vital signs reviewed.  Physical Exam  Constitutional: She is oriented to person, place, and time. She appears well-developed and well-nourished.  HENT:  Head: Normocephalic.  Right Ear: Hearing, tympanic membrane, external ear and ear canal normal.  Left Ear: Hearing, tympanic membrane, external ear and ear canal normal.  Nose: Nose normal.  Mouth/Throat: Uvula is midline, oropharynx is clear and moist and mucous membranes are normal.  Eyes: Conjunctivae and EOM are normal. Pupils are equal, round, and reactive to light.  Neck: Neck supple. No JVD present. No tracheal deviation present. No thyromegaly present.  Cardiovascular: Normal rate, regular rhythm, normal heart sounds and intact distal pulses.   Pulmonary/Chest: Effort normal and breath sounds normal.  Abdominal: Soft. Bowel sounds are normal. She exhibits no distension and no mass. There is no tenderness. There is  no rebound and no guarding.  Musculoskeletal: Normal range of motion. She exhibits no edema or tenderness.  Lymphadenopathy:    She has no cervical adenopathy.  Neurological: She is alert and oriented to person, place, and time. She has normal reflexes. No cranial nerve deficit. She exhibits normal muscle tone. Coordination normal.  Skin: Skin is warm and dry.  Psychiatric: Her behavior is normal. Judgment and thought content normal. She exhibits a depressed mood.       Assessment & Plan:   Problem List Items Addressed This Visit      Respiratory   Seasonal allergies    Seasonal allergies remain labile with current dosage of claratin. Recommend flonase. Start Singulair. Patient request referral for allergy testing which has been completed. Follow up if symptoms worsen or do not improve pending referral.       Relevant Orders   Ambulatory referral to Allergy     Other   Encounter for general adult medical examination with  abnormal findings - Primary    1) Anticipatory Guidance: Discussed importance of wearing a seatbelt while driving and not texting while driving; changing batteries in smoke detector at least once annually; wearing suntan lotion when outside; eating a balanced and moderate diet; getting physical activity at least 30 minutes per day.  2) Immunizations / Screenings / Labs:  Declines tetanus and influenza. All other immunizations are up to date per recommendations. Due for a dental and vision exam encouraged to be completed independently. Cervical cancer screening will be due in September 2018. Obtain Vitamin D for Vitamin D deficiency screening. All other screenings are up to date per recommendations. Obtain CBC, CMET, and lipid profile.    Overall well exam with risk factors for cardiovascular disease being minimal at this time. She does continue to struggle with depression, however is slowly improving and has returned to work with some continued difficulty. She continues  to work with psychiatry. BMI slightly overwieght with recommendation for increasing physical activity and modifying nutrition. Continue other healthy lifestyle behaviors and choices. Follow up prevention exam in 1 year. Follow up office visit pending blood work and for chronic conditions.       Relevant Orders   CBC (Completed)   Comprehensive metabolic panel (Completed)   Lipid panel (Completed)   VITAMIN D 25 Hydroxy (Vit-D Deficiency, Fractures) (Completed)       I have discontinued Ms. Garrabrant's diphenhydrAMINE, escitalopram, and ALPRAZolam. I am also having her start on montelukast. Additionally, I am having her maintain her CVS GLYCERIN ADULT, Fluticasone-Salmeterol, albuterol, Fluticasone Furoate, omeprazole, mirtazapine, loratadine, and levonorgestrel-ethinyl estradiol.   Meds ordered this encounter  Medications  . mirtazapine (REMERON) 15 MG tablet    Sig: Take 15 mg by mouth at bedtime.  Marland Kitchen loratadine (CLARITIN) 10 MG tablet    Sig: Take 1 tablet (10 mg total) by mouth daily.    Dispense:  90 tablet    Refill:  1    Order Specific Question:   Supervising Provider    Answer:   Hillard Danker A [[id number]]  . levonorgestrel-ethinyl estradiol (AVIANE,ALESSE,LESSINA) 0.1-20 MG-MCG tablet    Sig: Take 1 tablet by mouth daily.    Dispense:  1 Package    Refill:  11    Order Specific Question:   Supervising Provider    Answer:   Hillard Danker A [[id number]]  . montelukast (SINGULAIR) 10 MG tablet    Sig: Take 1 tablet (10 mg total) by mouth at bedtime.    Dispense:  30 tablet    Refill:  3    Order Specific Question:   Supervising Provider    Answer:   Hillard Danker A [[id number]]     Follow-up: Return in about 3 months (around 07/05/2016), or if symptoms worsen or fail to improve.   Jeanine Luz, FNP

## 2016-04-07 NOTE — Assessment & Plan Note (Signed)
Seasonal allergies remain labile with current dosage of claratin. Recommend flonase. Start Singulair. Patient request referral for allergy testing which has been completed. Follow up if symptoms worsen or do not improve pending referral.

## 2016-04-07 NOTE — Patient Instructions (Addendum)
Thank you for choosing [facility].  SUMMARY AND INSTRUCTIONS:  They will call to schedule your appointment with allergy medicine.  Talk with the psychiatrist about sleep.   Medication:  Your prescription(s) have been submitted to your pharmacy or been printed and provided for you. Please take as directed and contact our office if you believe you are having problem(s) with the medication(s) or have any questions.  Labs:  Please stop by the lab on the lower level of the building for your blood work. Your results will be released to [place] (or called to you) after review, usually within 72 hours after test completion. If any changes need to be made, you will be notified at that same time.  1.) The lab is open from 7:30am to 5:30 pm Monday-Friday 2.) No appointment is necessary 3.) Fasting (if needed) is 6-8 hours after food and drink; black coffee and water are okay   Follow up:  If your symptoms worsen or fail to improve, please contact our office for further instruction, or in case of emergency go directly to the emergency room at the closest medical facility.   Health Maintenance, Female Introduction Adopting a healthy lifestyle and getting preventive care can go a long way to promote health and wellness. Talk with your health care provider about what schedule of regular examinations is right for you. This is a good chance for you to check in with your provider about disease prevention and staying healthy. In between checkups, there are plenty of things you can do on your own. Experts have done a lot of research about which lifestyle changes and preventive measures are most likely to keep you healthy. Ask your health care provider for more information. Weight and diet Eat a healthy diet  Be sure to include plenty of vegetables, fruits, low-fat dairy products, and lean protein.  Do not eat a lot of foods high in solid fats, added sugars, or salt.  Get regular exercise. This  is one of the most important things you can do for your health.  Most adults should exercise for at least 150 minutes each week. The exercise should increase your heart rate and make you sweat (moderate-intensity exercise).  Most adults should also do strengthening exercises at least twice a week. This is in addition to the moderate-intensity exercise. Maintain a healthy weight  Body mass index (BMI) is a measurement that can be used to identify possible weight problems. It estimates body fat based on height and weight. Your health care provider can help determine your BMI and help you achieve or maintain a healthy weight.  For females 15 years of age and older:  A BMI below 18.5 is considered underweight.  A BMI of 18.5 to 24.9 is normal.  A BMI of 25 to 29.9 is considered overweight.  A BMI of 30 and above is considered obese. Watch levels of cholesterol and blood lipids  You should start having your blood tested for lipids and cholesterol at 30 years of age, then have this test every 5 years.  You may need to have your cholesterol levels checked more often if:  Your lipid or cholesterol levels are high.  You are older than 30 years of age.  You are at high risk for heart disease. Cancer screening Lung Cancer  Lung cancer screening is recommended for adults 30-68 years old who are at high risk for lung cancer because of a history of smoking.  A yearly low-dose CT scan of the lungs  is recommended for people who:  Currently smoke.  Have quit within the past 15 years.  Have at least a 30-pack-year history of smoking. A pack year is smoking an average of one pack of cigarettes a day for 1 year.  Yearly screening should continue until it has been 15 years since you quit.  Yearly screening should stop if you develop a health problem that would prevent you from having lung cancer treatment. Breast Cancer  Practice breast self-awareness. This means understanding how your  breasts normally appear and feel.  It also means doing regular breast self-exams. Let your health care provider know about any changes, no matter how small.  If you are in your 30s or 30s, you should have a clinical breast exam (CBE) by a health care provider every 1-3 years as part of a regular health exam.  If you are 72 or older, have a CBE every year. Also consider having a breast X-ray (mammogram) every year.  If you have a family history of breast cancer, talk to your health care provider about genetic screening.  If you are at high risk for breast cancer, talk to your health care provider about having an MRI and a mammogram every year.  Breast cancer gene (BRCA) assessment is recommended for women who have family members with BRCA-related cancers. BRCA-related cancers include:  Breast.  Ovarian.  Tubal.  Peritoneal cancers.  Results of the assessment will determine the need for genetic counseling and BRCA1 and BRCA2 testing. Cervical Cancer  Your health care provider may recommend that you be screened regularly for cancer of the pelvic organs (ovaries, uterus, and vagina). This screening involves a pelvic examination, including checking for microscopic changes to the surface of your cervix (Pap test). You may be encouraged to have this screening done every 3 years, beginning at age 30.  For women ages 64-65, health care providers may recommend pelvic exams and Pap testing every 3 years, or they may recommend the Pap and pelvic exam, combined with testing for human papilloma virus (HPV), every 5 years. Some types of HPV increase your risk of cervical cancer. Testing for HPV may also be done on women of any age with unclear Pap test results.  Other health care providers may not recommend any screening for nonpregnant women who are considered low risk for pelvic cancer and who do not have symptoms. Ask your health care provider if a screening pelvic exam is right for you.  If you  have had past treatment for cervical cancer or a condition that could lead to cancer, you need Pap tests and screening for cancer for at least 20 years after your treatment. If Pap tests have been discontinued, your risk factors (such as having a new sexual partner) need to be reassessed to determine if screening should resume. Some women have medical problems that increase the chance of getting cervical cancer. In these cases, your health care provider may recommend more frequent screening and Pap tests. Colorectal Cancer  This type of cancer can be detected and often prevented.  Routine colorectal cancer screening usually begins at 30 years of age and continues through 30 years of age.  Your health care provider may recommend screening at an earlier age if you have risk factors for colon cancer.  Your health care provider may also recommend using home test kits to check for hidden blood in the stool.  A small camera at the end of a tube can be used to examine your colon  directly (sigmoidoscopy or colonoscopy). This is done to check for the earliest forms of colorectal cancer.  Routine screening usually begins at age 50.  Direct examination of the colon should be repeated every 5-10 years through 30 years of age. However, you may need to be screened more often if early forms of precancerous polyps or small growths are found. Skin Cancer  Check your skin from head to toe regularly.  Tell your health care provider about any new moles or changes in moles, especially if there is a change in a mole's shape or color.  Also tell your health care provider if you have a mole that is larger than the size of a pencil eraser.  Always use sunscreen. Apply sunscreen liberally and repeatedly throughout the day.  Protect yourself by wearing long sleeves, pants, a wide-brimmed hat, and sunglasses whenever you are outside. Heart disease, diabetes, and high blood pressure  High blood pressure causes heart  disease and increases the risk of stroke. High blood pressure is more likely to develop in:  People who have blood pressure in the high end of the normal range (130-139/85-89 mm Hg).  People who are overweight or obese.  People who are African American.  If you are 57-60 years of age, have your blood pressure checked every 3-5 years. If you are 47 years of age or older, have your blood pressure checked every year. You should have your blood pressure measured twice-once when you are at a hospital or clinic, and once when you are not at a hospital or clinic. Record the average of the two measurements. To check your blood pressure when you are not at a hospital or clinic, you can use:  An automated blood pressure machine at a pharmacy.  A home blood pressure monitor.  If you are between 53 years and 37 years old, ask your health care provider if you should take aspirin to prevent strokes.  Have regular diabetes screenings. This involves taking a blood sample to check your fasting blood sugar level.  If you are at a normal weight and have a low risk for diabetes, have this test once every three years after 30 years of age.  If you are overweight and have a high risk for diabetes, consider being tested at a younger age or more often. Preventing infection Hepatitis B  If you have a higher risk for hepatitis B, you should be screened for this virus. You are considered at high risk for hepatitis B if:  You were born in a country where hepatitis B is common. Ask your health care provider which countries are considered high risk.  Your parents were born in a high-risk country, and you have not been immunized against hepatitis B (hepatitis B vaccine).  You have HIV or AIDS.  You use needles to inject street drugs.  You live with someone who has hepatitis B.  You have had sex with someone who has hepatitis B.  You get hemodialysis treatment.  You take certain medicines for conditions,  including cancer, organ transplantation, and autoimmune conditions. Hepatitis C  Blood testing is recommended for:  Everyone born from [birth year] through 1964.  Anyone with known risk factors for hepatitis C. Sexually transmitted infections (STIs)  You should be screened for sexually transmitted infections (STIs) including gonorrhea and chlamydia if:  You are sexually active and are younger than 30 years of age.  You are older than 29 years of age and your health care provider tells you that  you are at risk for this type of infection.  Your sexual activity has changed since you were last screened and you are at an increased risk for chlamydia or gonorrhea. Ask your health care provider if you are at risk.  If you do not have HIV, but are at risk, it may be recommended that you take a prescription medicine daily to prevent HIV infection. This is called pre-exposure prophylaxis (PrEP). You are considered at risk if:  You are sexually active and do not regularly use condoms or know the HIV status of your partner(s).  You take drugs by injection.  You are sexually active with a partner who has HIV. Talk with your health care provider about whether you are at high risk of being infected with HIV. If you choose to begin PrEP, you should first be tested for HIV. You should then be tested every 3 months for as long as you are taking PrEP. Pregnancy  If you are premenopausal and you may become pregnant, ask your health care provider about preconception counseling.  If you may become pregnant, take 400 to 800 micrograms (mcg) of folic acid every day.  If you want to prevent pregnancy, talk to your health care provider about birth control (contraception). Osteoporosis and menopause  Osteoporosis is a disease in which the bones lose minerals and strength with aging. This can result in serious bone fractures. Your risk for osteoporosis can be identified using a bone density scan.  If you are 90  years of age or older, or if you are at risk for osteoporosis and fractures, ask your health care provider if you should be screened.  Ask your health care provider whether you should take a calcium or vitamin D supplement to lower your risk for osteoporosis.  Menopause may have certain physical symptoms and risks.  Hormone replacement therapy may reduce some of these symptoms and risks. Talk to your health care provider about whether hormone replacement therapy is right for you. Follow these instructions at home:  Schedule regular health, dental, and eye exams.  Stay current with your immunizations.  Do not use any tobacco products including cigarettes, chewing tobacco, or electronic cigarettes.  If you are pregnant, do not drink alcohol.  If you are breastfeeding, limit how much and how often you drink alcohol.  Limit alcohol intake to no more than 1 drink per day for nonpregnant women. One drink equals 12 ounces of beer, 5 ounces of wine, or 1 ounces of hard liquor.  Do not use street drugs.  Do not share needles.  Ask your health care provider for help if you need support or information about quitting drugs.  Tell your health care provider if you often feel depressed.  Tell your health care provider if you have ever been abused or do not feel safe at home. This information is not intended to replace advice given to you by your health care provider. Make sure you discuss any questions you have with your health care provider. Document Released: 08/29/2010 Document Revised: 07/22/2015 Document Reviewed: 11/17/2014  2017 Elsevier

## 2016-04-09 ENCOUNTER — Other Ambulatory Visit: Payer: Self-pay | Admitting: Family

## 2016-04-09 DIAGNOSIS — E559 Vitamin D deficiency, unspecified: Secondary | ICD-10-CM

## 2016-04-09 MED ORDER — VITAMIN D3 1.25 MG (50000 UT) PO TABS: 50000.0000 [IU] | ORAL_TABLET | ORAL | 0 refills | Status: DC

## 2016-05-24 ENCOUNTER — Encounter: Payer: Self-pay | Admitting: Allergy

## 2016-05-24 ENCOUNTER — Ambulatory Visit (INDEPENDENT_AMBULATORY_CARE_PROVIDER_SITE_OTHER): Payer: BLUE CROSS/BLUE SHIELD | Admitting: Allergy

## 2016-05-24 VITALS — BP 102/62 | HR 68 | Temp 98.6°F | Resp 16 | Ht 63.5 in | Wt 163.0 lb

## 2016-05-24 DIAGNOSIS — L2089 Other atopic dermatitis: Secondary | ICD-10-CM

## 2016-05-24 DIAGNOSIS — J4540 Moderate persistent asthma, uncomplicated: Secondary | ICD-10-CM

## 2016-05-24 DIAGNOSIS — J309 Allergic rhinitis, unspecified: Secondary | ICD-10-CM

## 2016-05-24 DIAGNOSIS — H1010 Acute atopic conjunctivitis, unspecified eye: Secondary | ICD-10-CM

## 2016-05-24 MED ORDER — LEVOCETIRIZINE DIHYDROCHLORIDE 5 MG PO TABS: 5.0000 mg | ORAL_TABLET | Freq: Every evening | ORAL | 5 refills | Status: DC

## 2016-05-24 MED ORDER — BUDESONIDE-FORMOTEROL FUMARATE 160-4.5 MCG/ACT IN AERO: 2.0000 | INHALATION_SPRAY | Freq: Two times a day (BID) | RESPIRATORY_TRACT | 5 refills | Status: DC

## 2016-05-24 MED ORDER — OLOPATADINE HCL 0.2 % OP SOLN: 1.0000 [drp] | Freq: Every day | OPHTHALMIC | 5 refills | Status: DC

## 2016-05-24 MED ORDER — TRIAMCINOLONE ACETONIDE 0.5 % EX OINT: 1.0000 "application " | TOPICAL_OINTMENT | Freq: Two times a day (BID) | CUTANEOUS | 2 refills | Status: DC

## 2016-05-24 NOTE — Patient Instructions (Signed)
Allergies     - Use Xyzal 5 mg daily     - Use Nasacort 2 sprays each nostril daily for nasal congestion and drainage.  Use of proper nasal spray technique     - use Pataday 1 drop each eye daily as needed for itchy, watery, red eyes       - We will obtain environmental allergen profile. He will be called with results in about a week.   Asthma     - Stop Advair Diskus.  Start Symbicort 160  2 puffs twice a day with spacer     - Continue Singulair 10 mg at bedtime    - Use albuterol 2 puffs every 4-6 hours as needed for cough, wheeze, shortness of breath, chest tightness. Monitor frequency of use. May use 15-20 minutes prior to activity  Asthma control goals:   Full participation in all desired activities (may need albuterol before activity)  Albuterol use two time or less a week on average (not counting use with activity)  Cough interfering with sleep two time or less a month  Oral steroids no more than once a year  No hospitalizations  Eczema   - Continue daily moisturization with here Lubriderm lotion   - Use triamcinolone ointment 0.5% twice a day apply thin layer twice daily as needed for eczema flares  Follow-up 3-4 months

## 2016-05-24 NOTE — Progress Notes (Signed)
New Patient Note  RE: NARA PATERNOSTER MRN: [id number] DOB: [birth year] Date of Office Visit: 05/24/2016  Referring provider: Veryl Speak, FNP Primary care provider: Jeanine Luz, FNP  Chief Complaint: Allergy and asthma  History of present illness: Anna Webb is a 31 y.o. female presenting today for consultation for allergies and asthma. She has been in [place] for past 3 years and she moved from [place].  Everytime this time of year she reports she gets worsening allergies and asthma symptoms.  With her allergy symptoms she complains of runny nose and congestion, itchy eyes, sneezing, itchy throat as well as headaches.  She is currently on daily Claritin in morning, Singulair nightly (started about 2 months ago).  She is not using any nasal sprays at this time and she reports she does not like to put "liquids in her nose."    She was hospitalized 2 years ago for difficulty breathing and she was at that time diagnosed with asthma.   She was then started on Advair Diskus that she has been using 1 puff daily.  She uses albuterol on average once week but during allergy season she uses about 3 times a week. She also needs to use after exercising as she gets SOB.   She did require oral steroid course during spring 2017 for asthma flare otherwise no other hospitalizations or flares. She denies any nighttime awakenings.  She also has eczema mostly in her arm and leg folds as well as neck and chest.  She currently in not using anything to treat this.  She reports in the past she was on a compounded steroid cream when she was living in [place].  She states her eczema has been better here as she feels that [place] had more of a dryer climate that was worse for her eczema.  Review of systems: Review of Systems  Constitutional: Negative for chills, fever and malaise/fatigue.  HENT: Positive for congestion and tinnitus. Negative for ear discharge, ear pain, nosebleeds, sinus pain and  sore throat.   Eyes: Positive for redness. Negative for discharge.  Respiratory: Positive for cough, shortness of breath and wheezing.   Cardiovascular: Negative for chest pain.  Gastrointestinal: Negative for abdominal pain, heartburn, nausea and vomiting.  Musculoskeletal: Negative for joint pain and myalgias.  Skin: Positive for itching and rash.  Neurological: Positive for headaches. Negative for dizziness.    All other systems negative unless noted above in HPI  Past medical history: Past Medical History:  Diagnosis Date  . Anxiety   . Asthma   . Chicken pox   . Eczema   . GERD (gastroesophageal reflux disease)   . Migraines   . Urticaria     Past surgical history: No past surgical history  Family history:  Family History  Problem Relation Age of Onset  . Hyperlipidemia Mother   . Hypertension Mother   . Food Allergy Mother     tomato  . Migraines Mother   . Drug abuse Father   . Diabetes Father   . Eczema Father   . COPD Father   . Arthritis Maternal Grandmother   . Lung cancer Maternal Grandmother   . Prostate cancer Maternal Grandfather   . Asthma Sister   . Allergic rhinitis Neg Hx   . Angioedema Neg Hx     Social history: Social History Main Topics  . Smoking status: Former Smoker    Packs/day: 0.25    Years: 6.00    Types: Cigarettes  Quit date: 01/12/2016  . Smokeless tobacco: Never Used   She lives in an apartment with carpeting with allergic heating and cooling. There is a dog in the home. There is no concern for water damage or mildew or roaches in the home. 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 She has a 10 year smoking history from 2007 and quit on 01/12/2016.  Medication List: Allergies as of 05/24/2016      Reactions   Aspirin Nausea And Vomiting   Chocolate Rash   Penicillins Rash   Tomato Rash      Medication List       Accurate as of 05/24/16 12:09 PM. Always use your most recent med list.          albuterol 108  (90 Base) MCG/ACT inhaler Commonly known as:  PROVENTIL HFA;VENTOLIN HFA Inhale 2 puffs into the lungs every 6 (six) hours as needed for wheezing or shortness of breath.   budesonide-formoterol 160-4.5 MCG/ACT inhaler Commonly known as:  SYMBICORT Inhale 2 puffs into the lungs 2 (two) times daily. Use with spacer. Rinse Gargle spit after use   [facility] GLYCERIN ADULT 2 g suppository Generic drug:  glycerin adult Place 2 g rectally daily as needed for moderate constipation.   levocetirizine 5 MG tablet Commonly known as:  XYZAL Take 1 tablet (5 mg total) by mouth every evening.   levonorgestrel-ethinyl estradiol 0.1-20 MG-MCG tablet Commonly known as:  AVIANE,ALESSE,LESSINA Take 1 tablet by mouth daily.   loratadine 10 MG tablet Commonly known as:  CLARITIN Take 1 tablet (10 mg total) by mouth daily.   mirtazapine 30 MG tablet Commonly known as:  REMERON   montelukast 10 MG tablet Commonly known as:  SINGULAIR Take 1 tablet (10 mg total) by mouth at bedtime.   Olopatadine HCl 0.2 % Soln Commonly known as:  PATADAY Place 1 drop into both eyes daily.   omeprazole 40 MG capsule Commonly known as:  PRILOSEC Take 1 capsule (40 mg total) by mouth daily.   triamcinolone ointment 0.5 % Commonly known as:  KENALOG Apply 1 application topically 2 (two) times daily.   Vitamin D3 50000 units Tabs Take 50,000 Units by mouth once a week.       Known medication allergies: Allergies  Allergen Reactions  . Aspirin Nausea And Vomiting  . Chocolate Rash  . Penicillins Rash  . Tomato Rash     Physical examination: Blood pressure 102/62, pulse 68, temperature 98.6 F (37 C), temperature source Oral, resp. rate 16, height 5' 3.5" (1.613 m), weight 163 lb (73.9 kg), unknown if currently breastfeeding.  General: Alert, interactive, in no acute distress. HEENT: TMs pearly gray, turbinates moderately edematous with clear discharge, post-pharynx non erythematous. Neck: Supple without  lymphadenopathy. Lungs: Clear to auscultation without wheezing, rhonchi or rales. {no increased work of breathing. CV: Normal S1, S2 without murmurs. Abdomen: Nondistended, nontender. Skin: Warm and dry, without lesions or rashes. Extremities:  No clubbing, cyanosis or edema. Neuro:   Grossly intact.  Diagnositics/Labs:  Spirometry: FEV1: 3.19L  123%, FVC: 3.6L  119%, ratio consistent with Nonobstructive pattern  Allergy testing: Deferred today due to recent antihistamine use  Assessment and plan: Allergic rhinoconjunctivitis     - Use Xyzal 5 mg daily     - Use Nasacort 2 sprays each nostril daily for nasal congestion and drainage.  Use of proper nasal spray technique     - use Pataday 1 drop each eye daily as needed for itchy, watery, red  eyes       - We will obtain environmental allergen profile.   Asthma, moderate persistent     - Stop Advair Diskus.  Start Symbicort 160  2 puffs twice a day with spacer     - Continue Singulair 10 mg at bedtime    - Use albuterol 2 puffs every 4-6 hours as needed for cough, wheeze, shortness of breath, chest tightness. Monitor frequency of use. May use 15-20 minutes prior to activity  Asthma control goals:   Full participation in all desired activities (may need albuterol before activity)  Albuterol use two time or less a week on average (not counting use with activity)  Cough interfering with sleep two time or less a month  Oral steroids no more than once a year  No hospitalizations  Atopic dermatitis   - Continue daily moisturization with Lubriderm lotion   - Use triamcinolone ointment 0.5% twice a day apply thin layer twice daily as needed for eczema flares  Follow-up 3-4 months   I appreciate the opportunity to take part in Kollyns's care. Please do not hesitate to contact me with questions.  Sincerely,   Margo Aye, MD Allergy/Immunology [facility]

## 2016-05-29 ENCOUNTER — Other Ambulatory Visit: Payer: Self-pay | Admitting: *Deleted

## 2016-05-29 DIAGNOSIS — E559 Vitamin D deficiency, unspecified: Secondary | ICD-10-CM

## 2016-05-29 MED ORDER — VITAMIN D3 1.25 MG (50000 UT) PO TABS: 50000.0000 [IU] | ORAL_TABLET | ORAL | 0 refills | Status: DC

## 2016-06-28 ENCOUNTER — Ambulatory Visit: Payer: BLUE CROSS/BLUE SHIELD | Admitting: Family

## 2016-07-17 ENCOUNTER — Other Ambulatory Visit: Payer: Self-pay

## 2016-07-17 DIAGNOSIS — J4540 Moderate persistent asthma, uncomplicated: Secondary | ICD-10-CM

## 2016-07-17 MED ORDER — BUDESONIDE-FORMOTEROL FUMARATE 160-4.5 MCG/ACT IN AERO: 2.0000 | INHALATION_SPRAY | Freq: Two times a day (BID) | RESPIRATORY_TRACT | 0 refills | Status: DC

## 2016-07-17 NOTE — Telephone Encounter (Signed)
Received [facility] from [facility] in regards to a 90 day supply with no extra refills. Patient is suppose to follow up in June/july.

## 2016-08-25 ENCOUNTER — Other Ambulatory Visit: Payer: Self-pay | Admitting: Family

## 2016-08-25 DIAGNOSIS — E559 Vitamin D deficiency, unspecified: Secondary | ICD-10-CM

## 2016-11-23 ENCOUNTER — Encounter: Payer: Self-pay | Admitting: Family

## 2016-11-23 ENCOUNTER — Ambulatory Visit (INDEPENDENT_AMBULATORY_CARE_PROVIDER_SITE_OTHER): Payer: BLUE CROSS/BLUE SHIELD | Admitting: Family

## 2016-11-23 VITALS — BP 110/70 | HR 76 | Temp 98.4°F | Resp 16 | Ht 63.5 in | Wt 180.0 lb

## 2016-11-23 DIAGNOSIS — M6740 Ganglion, unspecified site: Secondary | ICD-10-CM

## 2016-11-23 DIAGNOSIS — J069 Acute upper respiratory infection, unspecified: Secondary | ICD-10-CM | POA: Insufficient documentation

## 2016-11-23 DIAGNOSIS — F322 Major depressive disorder, single episode, severe without psychotic features: Secondary | ICD-10-CM

## 2016-11-23 LAB — POCT INFLUENZA A/B: INFLUENZA A, POC: NEGATIVE

## 2016-11-23 MED ORDER — PREDNISONE 10 MG (21) PO TBPK: ORAL_TABLET | ORAL | 0 refills | Status: DC

## 2016-11-23 NOTE — Assessment & Plan Note (Signed)
Continues to experience symptoms of depression with current medication regimen and no adverse side effects. No suicidal ideations. Continue current dosage of trazodone. Encouraged to follow-up with psychiatry for additional treatment. No psychotic symptoms noted at present unable to function in her daily life. Follow-up if symptoms worsen or do not improve prior to psychiatry appointment.

## 2016-11-23 NOTE — Assessment & Plan Note (Signed)
In office flu test negative. Symptoms and exam consistent with acute upper respiratory infection/sinusitis. Treat conservatively with over-the-counter medications as needed for symptom relief and supportive care. Written prescription for prednisone provided if symptoms worsen or do not improve. Follow-up as needed.

## 2016-11-23 NOTE — Patient Instructions (Addendum)
Thank you for choosing [facility].  SUMMARY AND INSTRUCTIONS:  Recommend starting Aleve-D for congestion.  If your symptoms worsen, please start the prednisone.   Please follow up with psychiatry.  Follow up with Dr. Farris Has for the Ganglion Cyst  [address], [place], [place] [address] Phone: [phone]   Medication:  Your prescription(s) have been submitted to your pharmacy or been printed and provided for you. Please take as directed and contact our office if you believe you are having problem(s) with the medication(s) or have any questions.  Follow up:  If your symptoms worsen or fail to improve, please contact our office for further instruction, or in case of emergency go directly to the emergency room at the closest medical facility.

## 2016-11-23 NOTE — Assessment & Plan Note (Addendum)
Return of previously noted ganglion cyst. Encouraged to follow-up with orthopedics for permanent removal or drainage.

## 2016-11-23 NOTE — Progress Notes (Signed)
Subjective:    Patient ID: Anna Webb, female    DOB: [birth year], 31 y.o.   MRN: [id number]  Chief Complaint  Patient presents with  . Cyst    has a cyst on right wrist that has grown and causing pain, wants to be tested for the flu, fatigue, achey, weak, x2 days    HPI:  Anna Webb is a 31 y.o. female who  has a past medical history of Anxiety; Asthma; Chicken pox; Eczema; GERD (gastroesophageal reflux disease); Migraines; and Urticaria. and presents today for an office visit.  1.) Cyst -  Associated symptom of a cyst located on her right wrist has been going on for about 1.5 years and has increased in size and causing pain. Modifying factors include having it drained by Dr. Farris Has of orthopedics.  2.) Cold/flu symptoms - This is a new problem. Associated symptom of fatigue, achy, sinus pressure and weak have been going on for about 2 days. Denies fevers. Has had decreased appetite. Course of the symptoms have worsened since initial onset. 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 Denies any modifying factors or attempted treatments.   3.) Depression - Currently maintained on trazodone. Reports taking the medication as prescribed and denies adverse side effects or suicidal ideations. Does report continued feelings of depression and stress related to work. Has been working to possibly change locations. Has been seen by psychiatry in the past and has not followed up in the past several months. She is able to complete her job functions, but severity of her symptoms is enough to effect her performance.   Allergies  Allergen Reactions  . Aspirin Nausea And Vomiting  . Chocolate Rash  . Penicillins Rash  . Tomato Rash      Outpatient Medications Prior to Visit  Medication Sig Dispense Refill  . albuterol (PROVENTIL HFA;VENTOLIN HFA) 108 (90 Base) MCG/ACT inhaler Inhale 2 puffs into the lungs every 6 (six) hours as needed for  wheezing or shortness of breath. 1 Inhaler 2  . budesonide-formoterol (SYMBICORT) 160-4.5 MCG/ACT inhaler Inhale 2 puffs into the lungs 2 (two) times daily. Use with spacer. Rinse Gargle spit after use 3 Inhaler 0  . [facility] GL[facility]ERIN ADULT 2 G SUPP Place 2 g rectally daily as needed for moderate constipation.   2  . D3-50 50000 units capsule TAKE 50,000 UNITS BY MOUTH ONCE A WEEK. 12 capsule 0  . levocetirizine (XYZAL) 5 MG tablet Take 1 tablet (5 mg total) by mouth every evening. 30 tablet 5  . levonorgestrel-ethinyl estradiol (AVIANE,ALESSE,LESSINA) 0.1-20 MG-MCG tablet Take 1 tablet by mouth daily. 1 Package 11  . loratadine (CLARITIN) 10 MG tablet Take 1 tablet (10 mg total) by mouth daily. 90 tablet 1  . montelukast (SINGULAIR) 10 MG tablet Take 1 tablet (10 mg total) by mouth at bedtime. 30 tablet 3  . Olopatadine HCl (PATADAY) 0.2 % SOLN Place 1 drop into both eyes daily. 1 Bottle 5  . omeprazole (PRILOSEC) 40 MG capsule Take 1 capsule (40 mg total) by mouth daily. 90 capsule 1  . triamcinolone ointment (KENALOG) 0.5 % Apply 1 application topically 2 (two) times daily. 45 g 2  . mirtazapine (REMERON) 30 MG tablet      No facility-administered medications prior to visit.       No past surgical history on file.    Past Medical History:  Diagnosis Date  . Anxiety   . Asthma   .  Chicken pox   . Eczema   . GERD (gastroesophageal reflux disease)   . Migraines   . Urticaria       Review of Systems  Constitutional: Negative for chills and fever.  HENT: Positive for congestion, sinus pain and sinus pressure. Negative for ear pain and sore throat.   Respiratory: Positive for shortness of breath and wheezing. Negative for cough and chest tightness.   Cardiovascular: Negative for chest pain and leg swelling.  Musculoskeletal:       Positive for cyst on right wrist.   Neurological: Positive for headaches. Negative for weakness.  Psychiatric/Behavioral: Positive for decreased  concentration, dysphoric mood and sleep disturbance. Negative for hallucinations, self-injury and suicidal ideas. The patient is nervous/anxious.       Objective:    BP 110/70 (BP Location: Left Arm, Patient Position: Sitting, Cuff Size: Normal)   Pulse 76   Temp 98.4 F (36.9 C) (Oral)   Resp 16   Ht 5' 3.5" (1.613 m)   Wt 180 lb (81.6 kg)   SpO2 98%   BMI 31.39 kg/m  Nursing note and vital signs reviewed.  Physical Exam  Constitutional: She is oriented to person, place, and time. She appears well-developed and well-nourished. No distress.  HENT:  Right Ear: Hearing, tympanic membrane, external ear and ear canal normal.  Left Ear: Hearing, tympanic membrane, external ear and ear canal normal.  Nose: Right sinus exhibits maxillary sinus tenderness. Right sinus exhibits no frontal sinus tenderness. Left sinus exhibits maxillary sinus tenderness. Left sinus exhibits no frontal sinus tenderness.  Mouth/Throat: Uvula is midline, oropharynx is clear and moist and mucous membranes are normal.  Cardiovascular: Normal rate, regular rhythm, normal heart sounds and intact distal pulses.   Pulmonary/Chest: Effort normal and breath sounds normal.  Musculoskeletal:  Right wrist - Obvious deformity with no edema or discoloration. Palpable and mildly tender cyst located on the dorsal aspect. Pulses are intact and appropriate. ROM within the normal ranges.   Neurological: She is alert and oriented to person, place, and time.  Skin: Skin is warm and dry.  Psychiatric: Her behavior is normal. Judgment and thought content normal. Her mood appears anxious. She exhibits a depressed mood.       Assessment & Plan:   Problem List Items Addressed This Visit      Respiratory   Acute upper respiratory infection    In office flu test negative. Symptoms and exam consistent with acute upper respiratory infection/sinusitis. Treat conservatively with over-the-counter medications as needed for symptom relief  and supportive care. Written prescription for prednisone provided if symptoms worsen or do not improve. Follow-up as needed.      Relevant Orders   POCT Influenza A/B (Completed)     Other   Ganglion cyst    Return of previously noted ganglion cyst. Encouraged to follow-up with orthopedics for permanent removal or drainage.      Severe major depression (HCC) - Primary    Continues to experience symptoms of depression with current medication regimen and no adverse side effects. No suicidal ideations. Continue current dosage of trazodone. Encouraged to follow-up with psychiatry for additional treatment. No psychotic symptoms noted at present unable to function in her daily life. Follow-up if symptoms worsen or do not improve prior to psychiatry appointment.      Relevant Medications   traZODone (DESYREL) 50 MG tablet       I have discontinued Ms. Eagon's mirtazapine. I am also having her start on predniSONE. Additionally, I  am having her maintain her [facility] GLYCERIN ADULT, albuterol, omeprazole, loratadine, levonorgestrel-ethinyl estradiol, montelukast, levocetirizine, Olopatadine HCl, triamcinolone ointment, budesonide-formoterol, D3-50, and traZODone.   Meds ordered this encounter  Medications  . traZODone (DESYREL) 50 MG tablet    Sig: Take 50 mg by mouth at bedtime.  . predniSONE (STERAPRED UNI-PAK 21 TAB) 10 MG (21) TBPK tablet    Sig: Take 6 tablets x 1 day, 5 tablets x 1 day, 4 tablets x 1 day, 3 tablets x 1 day, 2 tablets x 1 day, 1 tablet x 1 day    Dispense:  21 tablet    Refill:  0    Order Specific Question:   Supervising Provider    Answer:   Hillard Danker A [[id number]]     Follow-up: Return in about 3 months (around 02/22/2017), or if symptoms worsen or fail to improve.  Jeanine Luz, FNP

## 2017-01-08 ENCOUNTER — Encounter: Payer: Self-pay | Admitting: Nurse Practitioner

## 2017-01-08 ENCOUNTER — Telehealth: Payer: Self-pay | Admitting: Nurse Practitioner

## 2017-01-08 ENCOUNTER — Ambulatory Visit (INDEPENDENT_AMBULATORY_CARE_PROVIDER_SITE_OTHER): Payer: BLUE CROSS/BLUE SHIELD | Admitting: Nurse Practitioner

## 2017-01-08 VITALS — BP 108/70 | HR 114 | Temp 98.5°F | Resp 16 | Ht 63.5 in | Wt 187.0 lb

## 2017-01-08 DIAGNOSIS — F419 Anxiety disorder, unspecified: Secondary | ICD-10-CM | POA: Diagnosis not present

## 2017-01-08 DIAGNOSIS — F4310 Post-traumatic stress disorder, unspecified: Secondary | ICD-10-CM

## 2017-01-08 DIAGNOSIS — F32A Depression, unspecified: Secondary | ICD-10-CM

## 2017-01-08 DIAGNOSIS — F329 Major depressive disorder, single episode, unspecified: Secondary | ICD-10-CM

## 2017-01-08 MED ORDER — VORTIOXETINE HBR 10 MG PO TABS: 10.0000 mg | ORAL_TABLET | Freq: Every day | ORAL | 3 refills | Status: DC

## 2017-01-08 NOTE — Telephone Encounter (Signed)
See my chart message

## 2017-01-08 NOTE — Progress Notes (Addendum)
Subjective:    Patient ID: [name], female    DOB: 31-Aug-1985, 31 y.o.   MRN: [id number]  HPI  Ms. Anna Webb is a 31 yo female who presents today to establish care. Shes transferring to me from another provider in the same clinic.  She has a chief complaint of depression and anxiety. shes suffered from anxiety, depression and PTSD for over 10 years. Shes been treated with lexapro, trazodone, xanax in the past but has currently stopped taking all of her psychiatric medications as she did not feel they were helping her mood. She has not been on any medication in over 2 weeks. [facility] was [facility] at [facility] week and given a prescription for abilify but she did not get it filled as she did not feel that she was listened to or given the correct medication at [facility]. She was diagnosed with Vitamin D deficiency in February and started on vitamin D supplement which she reports she has continued. [facility] for [facility] counseling which she feels helps. She scheduled an appointment to see their psychiatrist on this upcoming January 4th.  She reports her anxiety and depression have been so severe for the past several weeks that shes been unable to make it out of her house on most days. She has been missing work and had little interest in eating or sleeping. She has noticed shes gained weight because she has had no interest in being active. She worries every single day. She does enjoy watching tv and playing with her dog but those are the only things she finds any interest in doing. Shes been so depressed at times that she thought she would be better off dead. She has never thought of a plan for hurting herself. She denies thoughts of harming others  Review of Systems  See HPI  Past Medical History:  Diagnosis Date  . Anxiety   . Asthma   . Chicken pox   . Eczema   . GERD (gastroesophageal reflux disease)   . Migraines   . Urticaria      Social  History   Socioeconomic History  . Marital status: Single    Spouse name: Not on file  . Number of children: 0  . Years of education: 2112  . Highest education level: Not on file  Social Needs  . Financial resource strain: Not on file  . Food insecurity - worry: Not on file  . Food insecurity - inability: Not on file  . Transportation needs - medical: Not on file  . Transportation needs - non-medical: Not on file  Occupational History  . Occupation: Editor, commissioningetail Consultant  Tobacco Use  . Smoking status: Former Smoker    Packs/day: 0.25    Years: 6.00    Pack years: 1.50    Types: Cigarettes    Last attempt to quit: 01/12/2016    Years since quitting: 0.9  . Smokeless tobacco: Never Used  Substance and Sexual Activity  . Alcohol use: Yes    Comment: occasionally  . Drug use: No  . Sexual activity: Yes    Birth control/protection: Pill  Other Topics Concern  . Not on file  Social History Narrative  . Not on file    No past surgical history on file.  Family History  Problem Relation Age of Onset  . Hyperlipidemia Mother   . Hypertension Mother   . Food Allergy Mother  tomato  . Migraines Mother   . Drug abuse Father   . Diabetes Father   . Eczema Father   . COPD Father   . Arthritis Maternal Grandmother   . Lung cancer Maternal Grandmother   . Prostate cancer Maternal Grandfather   . Asthma Sister   . Allergic rhinitis Neg Hx   . Angioedema Neg Hx     Allergies  Allergen Reactions  . Aspirin Nausea And Vomiting  . Chocolate Rash  . Penicillins Rash  . Tomato Rash    Current Outpatient Medications on File Prior to Visit  Medication Sig Dispense Refill  . albuterol (PROVENTIL HFA;VENTOLIN HFA) 108 (90 Base) MCG/ACT inhaler Inhale 2 puffs into the lungs every 6 (six) hours as needed for wheezing or shortness of breath. 1 Inhaler 2  . budesonide-formoterol (SYMBICORT) 160-4.5 MCG/ACT inhaler Inhale 2 puffs into the lungs 2 (two) times daily. Use with  spacer. Rinse Gargle spit after use 3 Inhaler 0  . [facility] GLYCERIN ADULT 2 G SUPP Place 2 g rectally daily as needed for moderate constipation.   2  . D3-50 50000 units capsule TAKE 50,000 UNITS BY MOUTH ONCE A WEEK. 12 capsule 0  . levocetirizine (XYZAL) 5 MG tablet Take 1 tablet (5 mg total) by mouth every evening. 30 tablet 5  . levonorgestrel-ethinyl estradiol (AVIANE,ALESSE,LESSINA) 0.1-20 MG-MCG tablet Take 1 tablet by mouth daily. 1 Package 11  . loratadine (CLARITIN) 10 MG tablet Take 1 tablet (10 mg total) by mouth daily. 90 tablet 1  . montelukast (SINGULAIR) 10 MG tablet Take 1 tablet (10 mg total) by mouth at bedtime. 30 tablet 3  . Olopatadine HCl (PATADAY) 0.2 % SOLN Place 1 drop into both eyes daily. 1 Bottle 5  . omeprazole (PRILOSEC) 40 MG capsule Take 1 capsule (40 mg total) by mouth daily. 90 capsule 1  . traZODone (DESYREL) 50 MG tablet Take 50 mg by mouth at bedtime.    . triamcinolone ointment (KENALOG) 0.5 % Apply 1 application topically 2 (two) times daily. 45 g 2   No current facility-administered medications on file prior to visit.     BP 108/70 (BP Location: Left Arm, Patient Position: Sitting, Cuff Size: Large)   Pulse (!) 114   Temp 98.5 F (36.9 C) (Oral)   Resp 16   Ht 5' 3.5" (1.613 m)   Wt 187 lb (84.8 kg)   SpO2 98%   BMI 32.61 kg/m      Objective:   Physical Exam  Constitutional: She is oriented to person, place, and time. She appears well-developed and well-nourished.  HENT:  Head: Normocephalic and atraumatic.  Cardiovascular: Regular rhythm, normal heart sounds and intact distal pulses.  Pulmonary/Chest: Effort normal and breath sounds normal.  Neurological: She is alert and oriented to person, place, and time. Coordination normal.  Skin: Skin is warm and dry.  Psychiatric: Her speech is normal and behavior is normal. Thought content normal. Her mood appears anxious. Cognition and memory are normal. She does not express impulsivity or  inappropriate judgment. She exhibits a depressed mood. She expresses no suicidal plans and no homicidal plans.  Tearful during visit      Assessment & Plan:

## 2017-01-08 NOTE — Assessment & Plan Note (Signed)
Severe today. She has stopped all medications because they were not helpful. shes had thoughts that she would be better off dead. Today we contracted for safety. She promised that she did not have any plan to harm herself nor would she harm herself. She promised she would go straight to ED for suicidal or homicidal thoughts. Trintellix 5mg  tabs started today. She will take 5mg  daily for one week then increase to 10 MG TABS daily. Sample given for first [redacted] weeks along with medication assistance packet with instructions to return to clinic for submission. We discussed the side effects of this medication including suicidality and contracted for safety. Note given to excuse patient from work this week. She will continue to [facility] and keep psychiatry appointment for January. Shell return to me in about 2 weeks to follow up on her mood.

## 2017-01-08 NOTE — Patient Instructions (Addendum)
Start trintellix 5mg  once daily for 1 week, then increase the dose to 10mg  daily. I have provided samples for you for the first 2 weeks. Please bring back the trintellix assistance packet with your portion filled out and I will complete the rest to to get the medication at a reduced cost.  Trintellix can cause sexual dysfunction and upset stomach. This medication is generally effective at alleviating symptoms of anxiety and/or depression. Let me know if significant side effects do occur.  Rarely medications like trintellix have caused suicidal thoughts. If you experience any suicidal thoughts please stop this medication and go immediately to the emergency department.  I'd like to see you back in about 2 weeks to see how you are doing.  It was nice to meet you. Thanks for letting me take care of you today :)

## 2017-01-09 ENCOUNTER — Telehealth: Payer: Self-pay

## 2017-01-09 NOTE — Telephone Encounter (Signed)
The patient was given patient assistance packet for trintellix to return to clinic for submission

## 2017-01-09 NOTE — Telephone Encounter (Signed)
Received [facility] from [facility] [address] in regards to pts Trintellix states that pts insurance will not cover and requested 2 alternatives to be sent in place. Trazodone 300 mg tablet or Nefazodone HCL 200 mg tablet. Please advise

## 2017-01-30 ENCOUNTER — Ambulatory Visit: Payer: BLUE CROSS/BLUE SHIELD | Admitting: Nurse Practitioner

## 2017-02-05 ENCOUNTER — Ambulatory Visit: Payer: BLUE CROSS/BLUE SHIELD | Admitting: Nurse Practitioner

## 2017-02-07 ENCOUNTER — Encounter: Payer: Self-pay | Admitting: Nurse Practitioner

## 2017-02-07 ENCOUNTER — Ambulatory Visit (INDEPENDENT_AMBULATORY_CARE_PROVIDER_SITE_OTHER): Payer: BLUE CROSS/BLUE SHIELD | Admitting: Nurse Practitioner

## 2017-02-07 ENCOUNTER — Other Ambulatory Visit (INDEPENDENT_AMBULATORY_CARE_PROVIDER_SITE_OTHER): Payer: BLUE CROSS/BLUE SHIELD

## 2017-02-07 VITALS — BP 120/78 | HR 108 | Temp 99.3°F | Resp 16 | Ht 63.5 in | Wt 191.0 lb

## 2017-02-07 DIAGNOSIS — E559 Vitamin D deficiency, unspecified: Secondary | ICD-10-CM

## 2017-02-07 DIAGNOSIS — F329 Major depressive disorder, single episode, unspecified: Secondary | ICD-10-CM | POA: Diagnosis not present

## 2017-02-07 DIAGNOSIS — F419 Anxiety disorder, unspecified: Secondary | ICD-10-CM

## 2017-02-07 LAB — VITAMIN D 25 HYDROXY (VIT D DEFICIENCY, FRACTURES): VITD: 32.7 ng/mL (ref 30.00–100.00)

## 2017-02-07 MED ORDER — SERTRALINE HCL 50 MG PO TABS: 50.0000 mg | ORAL_TABLET | Freq: Every day | ORAL | 3 refills | Status: DC

## 2017-02-07 NOTE — Progress Notes (Signed)
Subjective:    Patient ID: Anna Webb, female    DOB: 1986/02/15, 31 y.o.   MRN: [id number]  HPI Anna Webb is a 31yo female who presents today for a follow up visit for anxiety and depression.  Anxiety and depression- she was seen here in the clinic about 1 month ago with severe anxiety and depression. She had been maintained on trazodone but had stopped taking the medication. She was not going to work or leaving her house because she felt so bad. She had failed many medications, but could not remember the names of them. We decided to start her on trintellix and she was to return in 1 month for follow up.   She did start the trintellix at 5mg  daily for one week then increased to 10mg  daily on second week. However, after she finished her samples she stopped the medication due to cost. She had been given a medication assistance form but lost it. She decided to try to change some of her life stressors, including quitting her job that made her very unhappy and finding a new job. She will start her new job next week. She says that reducing her stressors has tremendously improved her anxiety and depression, and she comes in today feeling very good. Her overall mood has improved day to day and she has very positive outlook today. She is requesting to go back on to some type of anxiety/depression medication as she does experience some amount of daily anxiety and depression, and states she "doesn't ever want to feel that bad again."   [facility]. She has an upcoming appointment with psychiatry on January 28.   [facility] 02/07/2017 11/23/2016 10/18/2015  Decreased Interest 1 2 2   Down, Depressed, Hopeless 1 1 2   PHQ - 2 Score 2 3 4   Altered sleeping 1 2 2   Tired, decreased energy 2 2 2   Change in appetite 0 2 2  Feeling bad or failure about yourself  1 1 2   Trouble concentrating 1 1 2   Moving slowly or fidgety/restless 1 2 0    Suicidal thoughts 1 0 1  PHQ-9 Score 9 13 15   Difficult doing work/chores - - Somewhat difficult   GAD 7 : Generalized Anxiety Score 02/07/2017 11/23/2016  Nervous, Anxious, on Edge 2 3  Control/stop worrying 2 2  Worry too much - different things 2 2  Trouble relaxing 2 2  Restless 2 1  Easily annoyed or irritable 2 2  Afraid - awful might happen 0 1  Total GAD 7 Score 12 13   Vitamin d deficiency- She was started 50,000 units once a week last November. She reports medication compliance. She had a repeat vitamin d level drawn in February that remained low and was instructed to continue her weekly dose of vitamin d and return around May for a recheck but she did not return. Shed like to have this level checked today.  Review of Systems  See HPI  Past Medical History:  Diagnosis Date  . Anxiety   . Asthma   . Chicken pox   . Eczema   . GERD (gastroesophageal reflux disease)   . Migraines   . Urticaria      Social History   Socioeconomic History  . Marital status: Single    Spouse name: Not on file  . Number of children: 0  . Years of education: 2912  . Highest  education level: Not on file  Social Needs  . Financial resource strain: Not on file  . Food insecurity - worry: Not on file  . Food insecurity - inability: Not on file  . Transportation needs - medical: Not on file  . Transportation needs - non-medical: Not on file  Occupational History  . Occupation: Editor, commissioningetail Consultant  Tobacco Use  . Smoking status: Former Smoker    Packs/day: 0.25    Years: 6.00    Pack years: 1.50    Types: Cigarettes    Last attempt to quit: 01/12/2016    Years since quitting: 1.0  . Smokeless tobacco: Never Used  Substance and Sexual Activity  . Alcohol use: Yes    Comment: occasionally  . Drug use: No  . Sexual activity: Yes    Birth control/protection: Pill  Other Topics Concern  . Not on file  Social History Narrative  . Not on file    No past surgical history on  file.  Family History  Problem Relation Age of Onset  . Hyperlipidemia Mother   . Hypertension Mother   . Food Allergy Mother        tomato  . Migraines Mother   . Drug abuse Father   . Diabetes Father   . Eczema Father   . COPD Father   . Arthritis Maternal Grandmother   . Lung cancer Maternal Grandmother   . Prostate cancer Maternal Grandfather   . Asthma Sister   . Allergic rhinitis Neg Hx   . Angioedema Neg Hx     Allergies  Allergen Reactions  . Aspirin Nausea And Vomiting  . Chocolate Rash  . Penicillins Rash  . Tomato Rash    Current Outpatient Medications on File Prior to Visit  Medication Sig Dispense Refill  . albuterol (PROVENTIL HFA;VENTOLIN HFA) 108 (90 Base) MCG/ACT inhaler Inhale 2 puffs into the lungs every 6 (six) hours as needed for wheezing or shortness of breath. 1 Inhaler 2  . budesonide-formoterol (SYMBICORT) 160-4.5 MCG/ACT inhaler Inhale 2 puffs into the lungs 2 (two) times daily. Use with spacer. Rinse Gargle spit after use 3 Inhaler 0  . [facility] GL[facility]CERIN ADULT 2 G SUPP Place 2 g rectally daily as needed for moderate constipation.   2  . D3-50 50000 units capsule TAKE 50,000 UNITS BY MOUTH ONCE A WEEK. 12 capsule 0  . levocetirizine (XYZAL) 5 MG tablet Take 1 tablet (5 mg total) by mouth every evening. 30 tablet 5  . levonorgestrel-ethinyl estradiol (AVIANE,ALESSE,LESSINA) 0.1-20 MG-MCG tablet Take 1 tablet by mouth daily. 1 Package 11  . loratadine (CLARITIN) 10 MG tablet Take 1 tablet (10 mg total) by mouth daily. 90 tablet 1  . montelukast (SINGULAIR) 10 MG tablet Take 1 tablet (10 mg total) by mouth at bedtime. 30 tablet 3  . Olopatadine HCl (PATADAY) 0.2 % SOLN Place 1 drop into both eyes daily. 1 Bottle 5  . omeprazole (PRILOSEC) 40 MG capsule Take 1 capsule (40 mg total) by mouth daily. 90 capsule 1  . triamcinolone ointment (KENALOG) 0.5 % Apply 1 application topically 2 (two) times daily. 45 g 2  . vortioxetine HBr (TRINTELLIX) 10 MG TABS Take 1  tablet (10 mg total) daily by mouth. 30 tablet 3   No current facility-administered medications on file prior to visit.     BP 120/78 (BP Location: Left Arm, Patient Position: Sitting, Cuff Size: Large)   Pulse (!) 108   Temp 99.3 F (37.4 C) (Oral)   Resp  16   Ht 5' 3.5" (1.613 m)   Wt 191 lb (86.6 kg)   SpO2 98%   BMI 33.30 kg/m       Objective:   Physical Exam  Constitutional: She is oriented to person, place, and time. She appears well-developed and well-nourished. No distress.  HENT:  Head: Normocephalic and atraumatic.  Cardiovascular: Normal rate, regular rhythm, normal heart sounds and intact distal pulses.  Pulmonary/Chest: Effort normal and breath sounds normal.  Neurological: She is alert and oriented to person, place, and time. Coordination normal.  Skin: Skin is warm and dry.  Psychiatric: She has a normal mood and affect. Judgment and thought content normal.  Vitals reviewed.     Assessment & Plan:   Anxiety and depression Much improved today based on patient history and PHQ, GAD scores. Requesting to start a daily medication. She does not want to go back on trintellix due to cost. She does not recall being on zoloft in past - sertraline (ZOLOFT) 50 MG tablet; Take 1 tablet (50 mg total) by mouth daily.  Dispense: 30 tablet; Refill: 3 I've explained to her that drugs of the SSRI class can have side effects such as weight gain, sexual dysfunction, insomnia, headache, nausea. Shell let me know if significant side effects do occur. We also dicussed suicidality associated with SSRIs, she will stop the drug and go immediately to ER if she experiences these symptoms. Crisis numbers given She will continue follow up with counseling, psychiatry We discussed follow up visit, she would like to come back after psychiatry appointment on 1/28. She will return in February for follow up of anxiety (she also has CPE scheduled that day) or sooner If she needs me.  Vitamin D  deficiency - Vitamin D (25 hydroxy); Future

## 2017-02-07 NOTE — Patient Instructions (Addendum)
Please head downstairs for lab work.  I have sent a prescription for zoloft 50mg  once daily to your pharmacy. Please start with 1/2 tablet once daily for 1 week and then increase to a full tablet once daily on week two as tolerated.  Common side effects include nausea, drowsiness and weight gain.  Rarely, patients have experienced suicidal thoughts when taking this medication. Please discontinue medication and go directly to ED if this occurs.   I will see you back in February, after you see the psychiatrist and we will see how you are doing.  Crisis hotline phone numbers: [phone] AND [phone]. These hotlines are available for you to call 24/7 if you are ever in an emotional crisis situation and need immediate help in addition to your local emergency department.   It was nice to see you. Thanks for letting me take care of you today :)

## 2017-02-08 ENCOUNTER — Other Ambulatory Visit: Payer: Self-pay | Admitting: Nurse Practitioner

## 2017-02-08 NOTE — Progress Notes (Signed)
Medication change 

## 2017-04-12 ENCOUNTER — Ambulatory Visit: Payer: BLUE CROSS/BLUE SHIELD | Admitting: Nurse Practitioner

## 2017-06-19 ENCOUNTER — Other Ambulatory Visit: Payer: Self-pay | Admitting: Nurse Practitioner

## 2017-06-19 DIAGNOSIS — F419 Anxiety disorder, unspecified: Principal | ICD-10-CM

## 2017-06-19 DIAGNOSIS — F329 Major depressive disorder, single episode, unspecified: Secondary | ICD-10-CM

## 2017-06-19 DIAGNOSIS — F32A Depression, unspecified: Secondary | ICD-10-CM

## 2017-08-28 ENCOUNTER — Other Ambulatory Visit: Payer: Self-pay | Admitting: Nurse Practitioner

## 2017-08-28 DIAGNOSIS — F419 Anxiety disorder, unspecified: Principal | ICD-10-CM

## 2017-08-28 DIAGNOSIS — F329 Major depressive disorder, single episode, unspecified: Secondary | ICD-10-CM

## 2017-08-28 DIAGNOSIS — F32A Depression, unspecified: Secondary | ICD-10-CM

## 2017-10-15 DIAGNOSIS — W3301XA Accidental discharge of shotgun, initial encounter: Secondary | ICD-10-CM

## 2017-10-15 HISTORY — DX: Accidental discharge of shotgun, initial encounter: W33.01XA

## 2018-01-22 ENCOUNTER — Ambulatory Visit (INDEPENDENT_AMBULATORY_CARE_PROVIDER_SITE_OTHER): Payer: Self-pay | Admitting: Family

## 2018-01-22 ENCOUNTER — Encounter: Payer: Self-pay | Admitting: Family

## 2018-01-22 VITALS — BP 130/84 | HR 83 | Temp 98.5°F | Ht 63.5 in

## 2018-01-22 DIAGNOSIS — J45909 Unspecified asthma, uncomplicated: Secondary | ICD-10-CM

## 2018-01-22 DIAGNOSIS — R6889 Other general symptoms and signs: Secondary | ICD-10-CM

## 2018-01-22 DIAGNOSIS — G43809 Other migraine, not intractable, without status migrainosus: Secondary | ICD-10-CM

## 2018-01-22 LAB — POC INFLUENZA A&B (BINAX/QUICKVUE)
INFLUENZA A, POC: NEGATIVE
INFLUENZA B, POC: NEGATIVE

## 2018-01-22 MED ORDER — AZITHROMYCIN 250 MG PO TABS: ORAL_TABLET | ORAL | 0 refills | Status: DC

## 2018-01-22 MED ORDER — PREDNISONE 20 MG PO TABS: 40.0000 mg | ORAL_TABLET | Freq: Every day | ORAL | 0 refills | Status: DC

## 2018-01-22 NOTE — Progress Notes (Signed)
Anna Webb is a 32 y.o. female with the following history as recorded in EpicCare:  Patient Active Problem List   Diagnosis Date Noted  . PTSD (post-traumatic stress disorder) 01/08/2017  . Acute upper respiratory infection 11/23/2016  . Encounter for general adult medical examination with abnormal findings 04/07/2016  . Seasonal allergies 04/07/2016  . Severe major depression (HCC) 11/26/2015  . Anxiety and depression 10/18/2015  . Ganglion cyst 04/20/2015  . Asthma 04/20/2015  . Moderate persistent asthma in adult without complication 07/01/2014  . Acute respiratory failure (HCC) 05/10/2014  . Asthma exacerbation   . Generalized anxiety disorder 03/02/2014  . Sleep disturbance 03/02/2014  . GERD (gastroesophageal reflux disease) 03/02/2014    Current Outpatient Medications  Medication Sig Dispense Refill  . levocetirizine (XYZAL) 5 MG tablet Take 1 tablet (5 mg total) by mouth every evening. 30 tablet 5  . levonorgestrel-ethinyl estradiol (AVIANE,ALESSE,LESSINA) 0.1-20 MG-MCG tablet Take 1 tablet by mouth daily. 1 Package 11  . omeprazole (PRILOSEC) 40 MG capsule Take 1 capsule (40 mg total) by mouth daily. 90 capsule 1  . sertraline (ZOLOFT) 50 MG tablet TAKE 1 TABLET BY MOUTH EVERY DAY 90 tablet 0  . azithromycin (ZITHROMAX) 250 MG tablet 2 tabs po qd x 1 day; 1 tablet per day x 4 days; 6 tablet 0  . CVS GLYCERIN ADULT 2 G SUPP Place 2 g rectally daily as needed for moderate constipation.   2  . predniSONE (DELTASONE) 20 MG tablet Take 2 tablets (40 mg total) by mouth daily with breakfast. 10 tablet 0   No current facility-administered medications for this visit.     Allergies: Aspirin; Chocolate; Penicillins; and Tomato  Past Medical History:  Diagnosis Date  . Anxiety   . Asthma   . Chicken pox   . Eczema   . GERD (gastroesophageal reflux disease)   . Migraines   . Urticaria     History reviewed. No pertinent surgical history.  Family History  Problem Relation  Age of Onset  . Hyperlipidemia Mother   . Hypertension Mother   . Food Allergy Mother        tomato  . Migraines Mother   . Drug abuse Father   . Diabetes Father   . Eczema Father   . COPD Father   . Arthritis Maternal Grandmother   . Lung cancer Maternal Grandmother   . Prostate cancer Maternal Grandfather   . Asthma Sister   . Allergic rhinitis Neg Hx   . Angioedema Neg Hx     Social History   Tobacco Use  . Smoking status: Former Smoker    Packs/day: 0.25    Years: 6.00    Pack years: 1.50    Types: Cigarettes    Last attempt to quit: 01/12/2016    Years since quitting: 2.0  . Smokeless tobacco: Never Used  Substance Use Topics  . Alcohol use: Yes    Comment: occasionally    Subjective:  Presents with concerns for flu-like symptoms; started on Saturday evening with sudden onset of body aches, headaches, feverish; does have seasonal allergies/ asthma- not currently on daily preventive medication due to cost concerns; does hear increased wheezing; history of migraine headaches- requesting lights to be off in exam room today; also requesting work note for today;   Objective:  Vitals:   01/22/18 1525  BP: 130/84  Pulse: 83  Temp: 98.5 F (36.9 C)  TempSrc: Oral  SpO2: 96%  Height: 5' 3.5" (1.613 m)  General: Well developed, well nourished, in no acute distress; prefers to have room darkened  Skin : Warm and dry.  Head: Normocephalic and atraumatic  Eyes: Sclera and conjunctiva clear; pupils round and reactive to light; extraocular movements intact  Ears: External normal; canals clear; tympanic membranes normal  Oropharynx: Pink, supple. No suspicious lesions  Neck: Supple without thyromegaly, adenopathy  Lungs: Respirations unlabored; clear to auscultation bilaterally without wheeze, rales, rhonchi  CVS exam: normal rate and regular rhythm.  Neurologic: Alert and oriented; speech intact; face symmetrical; moves all extremities well; CNII-XII intact without  focal deficit   Assessment:  1. Flu-like symptoms   2. Acute asthmatic bronchitis   3. Other migraine without status migrainosus, not intractable     Plan:  Rapid flu test in office is negative; outside of 72 hour window so Tamiflu would not have been effective; will go ahead and treat with Zithromax and Prednisone; she understands that prednisone will help with headache as well; work note given for today as requested; increase fluids, rest and follow up worse, no better.   No follow-ups on file.  No orders of the defined types were placed in this encounter.   Requested Prescriptions   Signed Prescriptions Disp Refills  . azithromycin (ZITHROMAX) 250 MG tablet 6 tablet 0    Sig: 2 tabs po qd x 1 day; 1 tablet per day x 4 days;  . predniSONE (DELTASONE) 20 MG tablet 10 tablet 0    Sig: Take 2 tablets (40 mg total) by mouth daily with breakfast.

## 2018-02-13 ENCOUNTER — Other Ambulatory Visit: Payer: Self-pay | Admitting: Nurse Practitioner

## 2018-02-13 DIAGNOSIS — F329 Major depressive disorder, single episode, unspecified: Secondary | ICD-10-CM

## 2018-02-13 DIAGNOSIS — F419 Anxiety disorder, unspecified: Principal | ICD-10-CM

## 2018-04-22 ENCOUNTER — Ambulatory Visit (INDEPENDENT_AMBULATORY_CARE_PROVIDER_SITE_OTHER): Payer: 59 | Admitting: Family

## 2018-04-22 ENCOUNTER — Encounter: Payer: Self-pay | Admitting: Family

## 2018-04-22 ENCOUNTER — Other Ambulatory Visit (INDEPENDENT_AMBULATORY_CARE_PROVIDER_SITE_OTHER): Payer: 59

## 2018-04-22 VITALS — BP 124/82 | HR 86 | Temp 98.2°F | Ht 63.5 in | Wt 205.1 lb

## 2018-04-22 DIAGNOSIS — R5383 Other fatigue: Secondary | ICD-10-CM | POA: Diagnosis not present

## 2018-04-22 DIAGNOSIS — J309 Allergic rhinitis, unspecified: Secondary | ICD-10-CM

## 2018-04-22 DIAGNOSIS — F329 Major depressive disorder, single episode, unspecified: Secondary | ICD-10-CM

## 2018-04-22 DIAGNOSIS — F32A Depression, unspecified: Secondary | ICD-10-CM

## 2018-04-22 DIAGNOSIS — F419 Anxiety disorder, unspecified: Secondary | ICD-10-CM | POA: Diagnosis not present

## 2018-04-22 DIAGNOSIS — H1010 Acute atopic conjunctivitis, unspecified eye: Secondary | ICD-10-CM

## 2018-04-22 LAB — VITAMIN B12: Vitamin B-12: 824 pg/mL (ref 211–911)

## 2018-04-22 LAB — COMPREHENSIVE METABOLIC PANEL
ALT: 10 U/L (ref 0–35)
AST: 14 U/L (ref 0–37)
Albumin: 4.2 g/dL (ref 3.5–5.2)
Alkaline Phosphatase: 81 U/L (ref 39–117)
BUN: 15 mg/dL (ref 6–23)
CO2: 26 mEq/L (ref 19–32)
Calcium: 9.4 mg/dL (ref 8.4–10.5)
Chloride: 105 mEq/L (ref 96–112)
Creatinine, Ser: 0.85 mg/dL (ref 0.40–1.20)
GFR: 93.88 mL/min (ref >60.00)
Glucose, Bld: 90 mg/dL (ref 70–99)
Potassium: 4.5 mEq/L (ref 3.5–5.1)
Sodium: 137 mEq/L (ref 135–145)
Total Bilirubin: 0.4 mg/dL (ref 0.2–1.2)
Total Protein: 7.3 g/dL (ref 6.0–8.3)

## 2018-04-22 LAB — CBC WITH DIFFERENTIAL/PLATELET
BASOS ABS: 0 10*3/uL (ref 0.0–0.1)
Basophils Relative: 0.2 % (ref 0.0–3.0)
Eosinophils Absolute: 0.3 10*3/uL (ref 0.0–0.7)
Eosinophils Relative: 3.1 % (ref 0.0–5.0)
HCT: 39.9 % (ref 36.0–46.0)
Hemoglobin: 13.4 g/dL (ref 12.0–15.0)
Lymphocytes Relative: 30.5 % (ref 12.0–46.0)
Lymphs Abs: 3.1 10*3/uL (ref 0.7–4.0)
MCHC: 33.5 g/dL (ref 30.0–36.0)
MCV: 85.1 fl (ref 78.0–100.0)
Monocytes Absolute: 0.5 10*3/uL (ref 0.1–1.0)
Monocytes Relative: 5.3 % (ref 3.0–12.0)
Neutro Abs: 6.3 10*3/uL (ref 1.4–7.7)
Neutrophils Relative %: 60.9 % (ref 43.0–77.0)
Platelets: 328 10*3/uL (ref 150.0–400.0)
RBC: 4.7 Mil/uL (ref 3.87–5.11)
RDW: 14.8 % (ref 11.5–15.5)
WBC: 10.2 10*3/uL (ref 4.0–10.5)

## 2018-04-22 LAB — TSH: TSH: 1.09 u[IU]/mL (ref 0.35–4.50)

## 2018-04-22 LAB — VITAMIN D 25 HYDROXY (VIT D DEFICIENCY, FRACTURES): VITD: 22.62 ng/mL — ABNORMAL LOW (ref 30.00–100.00)

## 2018-04-22 MED ORDER — OMEPRAZOLE 40 MG PO CPDR: 40.0000 mg | DELAYED_RELEASE_CAPSULE | Freq: Every day | ORAL | 1 refills | Status: AC

## 2018-04-22 MED ORDER — SERTRALINE HCL 100 MG PO TABS: 50.0000 mg | ORAL_TABLET | Freq: Every day | ORAL | 0 refills | Status: DC

## 2018-04-22 MED ORDER — LEVOCETIRIZINE DIHYDROCHLORIDE 5 MG PO TABS: 5.0000 mg | ORAL_TABLET | Freq: Every evening | ORAL | 1 refills | Status: AC

## 2018-04-22 MED ORDER — SERTRALINE HCL 100 MG PO TABS: 100.0000 mg | ORAL_TABLET | Freq: Every day | ORAL | 0 refills | Status: DC
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
## 2018-04-22 NOTE — Patient Instructions (Addendum)
[email or web address] [phone]

## 2018-04-23 ENCOUNTER — Other Ambulatory Visit: Payer: Self-pay | Admitting: Family

## 2018-04-23 MED ORDER — VITAMIN D (ERGOCALCIFEROL) 1.25 MG (50000 UNIT) PO CAPS: 50000.0000 [IU] | ORAL_CAPSULE | ORAL | 0 refills | Status: AC

## 2018-07-14 ENCOUNTER — Other Ambulatory Visit: Payer: Self-pay | Admitting: Family

## 2018-08-20 ENCOUNTER — Encounter: Payer: 59 | Admitting: Obstetrics & Gynecology

## 2018-09-04 ENCOUNTER — Other Ambulatory Visit: Payer: Self-pay

## 2018-09-04 ENCOUNTER — Ambulatory Visit (INDEPENDENT_AMBULATORY_CARE_PROVIDER_SITE_OTHER): Payer: 59 | Admitting: Family

## 2018-09-04 ENCOUNTER — Other Ambulatory Visit (INDEPENDENT_AMBULATORY_CARE_PROVIDER_SITE_OTHER): Payer: 59

## 2018-09-04 ENCOUNTER — Encounter: Payer: Self-pay | Admitting: Family

## 2018-09-04 VITALS — BP 114/72 | HR 92 | Temp 97.3°F | Ht 63.5 in | Wt 208.2 lb

## 2018-09-04 DIAGNOSIS — F419 Anxiety disorder, unspecified: Secondary | ICD-10-CM | POA: Diagnosis not present

## 2018-09-04 DIAGNOSIS — M545 Low back pain, unspecified: Secondary | ICD-10-CM

## 2018-09-04 DIAGNOSIS — G8929 Other chronic pain: Secondary | ICD-10-CM

## 2018-09-04 DIAGNOSIS — R635 Abnormal weight gain: Secondary | ICD-10-CM

## 2018-09-04 DIAGNOSIS — J4540 Moderate persistent asthma, uncomplicated: Secondary | ICD-10-CM | POA: Diagnosis not present

## 2018-09-04 DIAGNOSIS — F329 Major depressive disorder, single episode, unspecified: Secondary | ICD-10-CM

## 2018-09-04 LAB — COMPREHENSIVE METABOLIC PANEL
ALT: 10 U/L (ref 0–35)
AST: 11 U/L (ref 0–37)
Albumin: 4 g/dL (ref 3.5–5.2)
Alkaline Phosphatase: 80 U/L (ref 39–117)
BUN: 10 mg/dL (ref 6–23)
CO2: 25 mEq/L (ref 19–32)
Calcium: 9 mg/dL (ref 8.4–10.5)
Chloride: 105 mEq/L (ref 96–112)
Creatinine, Ser: 0.7 mg/dL (ref 0.40–1.20)
GFR: 117.19 mL/min (ref >60.00)
Glucose, Bld: 82 mg/dL (ref 70–99)
Potassium: 3.9 mEq/L (ref 3.5–5.1)
Sodium: 138 mEq/L (ref 135–145)
Total Bilirubin: 0.3 mg/dL (ref 0.2–1.2)
Total Protein: 7.1 g/dL (ref 6.0–8.3)

## 2018-09-04 LAB — HEMOGLOBIN A1C: Hgb A1c MFr Bld: 5.7 % (ref 4.6–6.5)

## 2018-09-04 MED ORDER — TRIAMCINOLONE ACETONIDE 0.5 % EX OINT: 1.0000 "application " | TOPICAL_OINTMENT | Freq: Two times a day (BID) | CUTANEOUS | 0 refills | Status: AC

## 2018-09-04 MED ORDER — BUDESONIDE-FORMOTEROL FUMARATE 160-4.5 MCG/ACT IN AERO: 2.0000 | INHALATION_SPRAY | Freq: Two times a day (BID) | RESPIRATORY_TRACT | 6 refills | Status: AC
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
## 2018-09-17 ENCOUNTER — Other Ambulatory Visit: Payer: Self-pay

## 2018-09-17 ENCOUNTER — Encounter: Payer: Self-pay | Admitting: Obstetrics & Gynecology

## 2018-09-17 ENCOUNTER — Ambulatory Visit (INDEPENDENT_AMBULATORY_CARE_PROVIDER_SITE_OTHER): Payer: 59 | Admitting: Obstetrics & Gynecology

## 2018-09-17 VITALS — BP 108/70 | HR 76 | Temp 97.9°F | Ht 64.0 in | Wt 207.0 lb

## 2018-09-17 DIAGNOSIS — Z124 Encounter for screening for malignant neoplasm of cervix: Secondary | ICD-10-CM | POA: Diagnosis not present

## 2018-09-17 DIAGNOSIS — M25552 Pain in left hip: Secondary | ICD-10-CM

## 2018-09-17 DIAGNOSIS — Z01419 Encounter for gynecological examination (general) (routine) without abnormal findings: Secondary | ICD-10-CM | POA: Diagnosis not present

## 2018-09-17 DIAGNOSIS — R87618 Other abnormal cytological findings on specimens from cervix uteri: Secondary | ICD-10-CM | POA: Diagnosis not present
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
## 2018-09-19 ENCOUNTER — Other Ambulatory Visit (HOSPITAL_COMMUNITY)
Admission: RE | Admit: 2018-09-19 | Discharge: 2018-09-19 | Disposition: A | Discharge status: Home / Self Care | Payer: 59 | Source: Ambulatory Visit | Attending: Obstetrics & Gynecology | Admitting: Obstetrics & Gynecology

## 2018-09-19 DIAGNOSIS — R87618 Other abnormal cytological findings on specimens from cervix uteri: Secondary | ICD-10-CM | POA: Diagnosis present

## 2018-09-19 DIAGNOSIS — Z124 Encounter for screening for malignant neoplasm of cervix: Secondary | ICD-10-CM | POA: Diagnosis present

## 2018-09-23 LAB — CYTOLOGY - PAP
Adequacy: ABSENT
Diagnosis: NEGATIVE
HPV: NOT DETECTED
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
## 2019-09-22 ENCOUNTER — Ambulatory Visit: Payer: 59 | Admitting: Obstetrics & Gynecology

## 2019-09-22 ENCOUNTER — Other Ambulatory Visit: Payer: Self-pay

## 2019-09-22 ENCOUNTER — Encounter: Payer: Self-pay | Admitting: Obstetrics & Gynecology

## 2019-09-22 VITALS — BP 110/70 | HR 68 | Resp 16 | Ht 64.25 in | Wt 193.0 lb

## 2019-09-22 DIAGNOSIS — Z01419 Encounter for gynecological examination (general) (routine) without abnormal findings: Secondary | ICD-10-CM | POA: Diagnosis not present

## 2019-09-22 DIAGNOSIS — Z0184 Encounter for antibody response examination: Secondary | ICD-10-CM

## 2019-09-22 DIAGNOSIS — Z319 Encounter for procreative management, unspecified: Secondary | ICD-10-CM

## 2019-09-23 LAB — RUBELLA SCREEN: Rubella Antibodies, IGG: 2.21 index (ref >0.99)

## 2019-11-18 ENCOUNTER — Other Ambulatory Visit: Payer: 59

## 2020-02-09 ENCOUNTER — Other Ambulatory Visit: Payer: Self-pay

## 2020-02-09 ENCOUNTER — Ambulatory Visit (INDEPENDENT_AMBULATORY_CARE_PROVIDER_SITE_OTHER): Payer: 59 | Admitting: Obstetrics & Gynecology

## 2020-02-09 ENCOUNTER — Encounter: Payer: Self-pay | Admitting: Obstetrics & Gynecology

## 2020-02-09 VITALS — BP 113/77 | HR 58 | Wt 193.0 lb

## 2020-02-09 DIAGNOSIS — M5442 Lumbago with sciatica, left side: Secondary | ICD-10-CM | POA: Diagnosis not present

## 2020-02-09 DIAGNOSIS — W3400XA Accidental discharge from unspecified firearms or gun, initial encounter: Secondary | ICD-10-CM

## 2020-02-09 DIAGNOSIS — G8929 Other chronic pain: Secondary | ICD-10-CM | POA: Diagnosis not present
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
## 2020-02-23 ENCOUNTER — Other Ambulatory Visit: Payer: Self-pay

## 2020-02-23 ENCOUNTER — Ambulatory Visit (INDEPENDENT_AMBULATORY_CARE_PROVIDER_SITE_OTHER): Payer: 59

## 2020-02-23 ENCOUNTER — Encounter: Payer: Self-pay | Admitting: Family Medicine

## 2020-02-23 ENCOUNTER — Ambulatory Visit: Payer: 59 | Admitting: Family Medicine

## 2020-02-23 VITALS — BP 130/90 | HR 85 | Ht 64.0 in | Wt 192.0 lb

## 2020-02-23 DIAGNOSIS — W3400XA Accidental discharge from unspecified firearms or gun, initial encounter: Secondary | ICD-10-CM

## 2020-02-23 DIAGNOSIS — M25552 Pain in left hip: Secondary | ICD-10-CM

## 2020-02-23 DIAGNOSIS — M5442 Lumbago with sciatica, left side: Secondary | ICD-10-CM | POA: Diagnosis not present

## 2020-02-23 DIAGNOSIS — G8929 Other chronic pain: Secondary | ICD-10-CM

## 2020-02-23 MED ORDER — GABAPENTIN 300 MG PO CAPS: 300.0000 mg | ORAL_CAPSULE | Freq: Three times a day (TID) | ORAL | 3 refills | Status: DC | PRN

## 2020-02-23 NOTE — Patient Instructions (Addendum)
Thank you for coming in today.  Please get an Xray today before you leave  I've referred you to Physical Therapy.  Let us know if you don't hear from them in one week.  Try the gabapentin.   Recheck in about a month.   Happy to do the paperwork.   Let me know what accomodation you need in that ADA paperwork.
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
## 2020-03-01 ENCOUNTER — Encounter: Payer: Self-pay | Admitting: Family Medicine

## 2020-03-01 DIAGNOSIS — G8929 Other chronic pain: Secondary | ICD-10-CM

## 2020-03-01 DIAGNOSIS — W3400XA Accidental discharge from unspecified firearms or gun, initial encounter: Secondary | ICD-10-CM

## 2020-03-01 DIAGNOSIS — M25552 Pain in left hip: Secondary | ICD-10-CM

## 2020-03-04 ENCOUNTER — Encounter: Payer: Self-pay | Admitting: Family Medicine

## 2020-03-04 NOTE — Telephone Encounter (Signed)
Physical therapy ordered.  You should hear soon about scheduling.
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
## 2020-03-25 ENCOUNTER — Ambulatory Visit: Payer: BC Managed Care – PPO | Admitting: Family Medicine

## 2020-03-25 ENCOUNTER — Other Ambulatory Visit: Payer: Self-pay

## 2020-03-25 VITALS — BP 108/82 | HR 83 | Ht 64.0 in | Wt 188.6 lb

## 2020-03-25 DIAGNOSIS — M25552 Pain in left hip: Secondary | ICD-10-CM | POA: Diagnosis not present

## 2020-03-25 DIAGNOSIS — M5442 Lumbago with sciatica, left side: Secondary | ICD-10-CM

## 2020-03-25 DIAGNOSIS — G8929 Other chronic pain: Secondary | ICD-10-CM | POA: Diagnosis not present

## 2020-03-25 DIAGNOSIS — F322 Major depressive disorder, single episode, severe without psychotic features: Secondary | ICD-10-CM | POA: Diagnosis not present

## 2020-03-25 MED ORDER — TIZANIDINE HCL 4 MG PO TABS
4.0000 mg | ORAL_TABLET | Freq: Four times a day (QID) | ORAL | 4 refills | Status: DC | PRN
Start: 2020-03-25 — End: 2020-05-06

## 2020-03-25 NOTE — Patient Instructions (Signed)
Thank you for coming in today.  Lets give the PT a chance.   Work on reducing sensation.   Look up conative behavioral therapy for chronic pain (you can start some self guided treatment now.   Ok to take up to 3 gabapentin in 8 hours.   Recheck in 6 weeks.

## 2020-03-30 ENCOUNTER — Telehealth: Payer: Self-pay | Admitting: Family Medicine

## 2020-03-30 ENCOUNTER — Encounter: Payer: Self-pay | Admitting: Family Medicine
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
## 2020-05-06 ENCOUNTER — Encounter: Payer: Self-pay | Admitting: Neurology

## 2020-05-06 ENCOUNTER — Encounter: Payer: Self-pay | Admitting: Family Medicine

## 2020-05-06 ENCOUNTER — Other Ambulatory Visit: Payer: Self-pay

## 2020-05-06 ENCOUNTER — Ambulatory Visit: Payer: BC Managed Care – PPO | Admitting: Family Medicine

## 2020-05-06 VITALS — BP 102/70 | HR 82 | Ht 64.0 in | Wt 196.2 lb

## 2020-05-06 DIAGNOSIS — M79605 Pain in left leg: Secondary | ICD-10-CM | POA: Diagnosis not present

## 2020-05-06 DIAGNOSIS — W3400XA Accidental discharge from unspecified firearms or gun, initial encounter: Secondary | ICD-10-CM

## 2020-05-06 DIAGNOSIS — M25552 Pain in left hip: Secondary | ICD-10-CM | POA: Diagnosis not present

## 2020-05-06 MED ORDER — PREGABALIN 75 MG PO CAPS
75.0000 mg | ORAL_CAPSULE | Freq: Two times a day (BID) | ORAL | 3 refills | Status: DC | PRN
Start: 2020-05-06 — End: 2020-06-18

## 2020-05-06 NOTE — Patient Instructions (Signed)
Thank you for coming in today.  Plan for Nerve Conduction Study/EMG.   Recheck following Test.   STOP Gabapentin and Tizanidine.   Try lyrica.  Recheck following nerve test.   Let me know if there is a problem.    Electromyoneurogram Electromyoneurogram is a test to check how well your muscles and nerves are working. This procedure includes the combined use of electromyogram (EMG) and nerve conduction study (NCS). EMG is used to look for muscular disorders. NCS, which is also called electroneurogram, measures how well your nerves are controlling your muscles. The procedures are usually done together to check if your muscles and nerves are healthy. If the results of the tests are abnormal, this may indicate disease or injury, such as a neuromuscular disease or peripheral nerve damage. Tell a health care provider about:  Any allergies you have.  All medicines you are taking, including vitamins, herbs, eye drops, creams, and over-the-counter medicines.  Any problems you or family members have had with anesthetic medicines.  Any blood disorders you have.  Any surgeries you have had.  Any medical conditions you have.  If you have a pacemaker.  Whether you are pregnant or may be pregnant. What are the risks? Generally, this is a safe procedure. However, problems may occur, including:  Infection where the electrodes were inserted.  Bleeding. What happens before the procedure? Medicines Ask your health care provider about:  Changing or stopping your regular medicines. This is especially important if you are taking diabetes medicines or blood thinners.  Taking medicines such as aspirin and ibuprofen. These medicines can thin your blood. Do not take these medicines unless your health care provider tells you to take them.  Taking over-the-counter medicines, vitamins, herbs, and supplements. General instructions  Your health care provider may ask you to avoid: ? Beverages  that have caffeine, such as coffee and tea. ? Any products that contain nicotine or tobacco. These products include cigarettes, e-cigarettes, and chewing tobacco. If you need help quitting, ask your health care provider.  Do not use lotions or creams on the same day that you will be having the procedure. What happens during the procedure? For EMG  Your health care provider will ask you to stay in a position so that he or she can access the muscle that will be studied. You may be standing, sitting, or lying down.  You may be given a medicine that numbs the area (local anesthetic).  A very thin needle that has an electrode will be inserted into your muscle.  Another small electrode will be placed on your skin near the muscle.  Your health care provider will ask you to continue to remain still.  The electrodes will send a signal that tells about the electrical activity of your muscles. You may see this on a monitor or hear it in the room.  After your muscles have been studied at rest, your health care provider will ask you to contract or flex your muscles. The electrodes will send a signal that tells about the electrical activity of your muscles.  Your health care provider will remove the electrodes and the electrode needles when the procedure is finished. The procedure may vary among health care providers and hospitals.   For NCS  An electrode that records your nerve activity (recording electrode) will be placed on your skin by the muscle that is being studied.  An electrode that is used as a reference (reference electrode) will be placed near the recording electrode.  A paste or gel will be applied to your skin between the recording electrode and the reference electrode.  Your nerve will be stimulated with a mild shock. Your health care provider will measure how much time it takes for your muscle to react.  Your health care provider will remove the electrodes and the gel when the  procedure is finished. The procedure may vary among health care providers and hospitals.   What happens after the procedure?  It is up to you to get the results of your procedure. Ask your health care provider, or the department that is doing the procedure, when your results will be ready.  Your health care provider may: ? Give you medicines for any pain. ? Monitor the insertion sites to make sure that bleeding stops. Summary  Electromyoneurogram is a test to check how well your muscles and nerves are working.  If the results of the tests are abnormal, this may indicate disease or injury.  This is a safe procedure. However, problems may occur, such as bleeding and infection.  Your health care provider will do two tests to complete this procedure. One checks your muscles (EMG) and another checks your nerves (NCS).  It is up to you to get the results of your procedure. Ask your health care provider, or the department that is doing the procedure, when your results will be ready. This information is not intended to replace advice given to you by your health care provider. Make sure you discuss any questions you have with your health care provider. Document Revised: 10/30/2017 Document Reviewed: 10/12/2017 Elsevier Patient Education  2021 [employer].

## 2020-06-08 ENCOUNTER — Ambulatory Visit: Payer: Self-pay | Admitting: Neurology

## 2020-06-08 ENCOUNTER — Other Ambulatory Visit: Payer: Self-pay

## 2020-06-08 DIAGNOSIS — M25552 Pain in left hip: Secondary | ICD-10-CM

## 2020-06-08 DIAGNOSIS — W3400XA Accidental discharge from unspecified firearms or gun, initial encounter: Secondary | ICD-10-CM

## 2020-06-08 DIAGNOSIS — M79605 Pain in left leg: Secondary | ICD-10-CM

## 2020-06-08 NOTE — Progress Notes (Signed)
Nerve conduction study looks normal to neurology.  This tells [place] that you do not have a severe nerve injury which is good news but does not tell us why you hurt which is frustrating and annoying.  Nerve conduction studies can miss mild or subtle disease.  Recommend that you return to clinic to check back and go over the results and discuss what next steps are.
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
## 2020-06-18 ENCOUNTER — Other Ambulatory Visit: Payer: Self-pay

## 2020-06-18 ENCOUNTER — Ambulatory Visit (INDEPENDENT_AMBULATORY_CARE_PROVIDER_SITE_OTHER): Payer: BC Managed Care – PPO

## 2020-06-18 ENCOUNTER — Ambulatory Visit: Payer: BC Managed Care – PPO | Admitting: Family Medicine

## 2020-06-18 VITALS — BP 104/72 | HR 71 | Ht 64.0 in | Wt 199.0 lb

## 2020-06-18 DIAGNOSIS — M25572 Pain in left ankle and joints of left foot: Secondary | ICD-10-CM

## 2020-06-18 DIAGNOSIS — M79605 Pain in left leg: Secondary | ICD-10-CM

## 2020-06-18 DIAGNOSIS — G8929 Other chronic pain: Secondary | ICD-10-CM | POA: Diagnosis not present

## 2020-06-18 DIAGNOSIS — W3400XA Accidental discharge from unspecified firearms or gun, initial encounter: Secondary | ICD-10-CM

## 2020-06-18 MED ORDER — PREGABALIN 75 MG PO CAPS: 75.0000 mg | ORAL_CAPSULE | Freq: Two times a day (BID) | ORAL | 3 refills | Status: AC | PRN

## 2020-06-18 NOTE — Patient Instructions (Addendum)
Thank you for coming in today.  Please get an Xray today before you leave  Please perform the exercise program that we have prepared for you and gone over in detail on a daily basis.  In addition to the handout you were provided you can access your program through: [email or web address]   Your unique program code is:  [email or web address]  I recommend you obtained a compression sleeve to help with your joint problems. There are many options on the market however I recommend obtaining a full ankle Body Helix compression sleeve.  You can find information (including how to appropriate measure yourself for sizing) can be found at www.Body [email or web address].ch.  Many of these products are health savings account (HSA) eligible.   You can use the compression sleeve at any time throughout the day but is most important to use while being active as well as for 2 hours post-activity.   It is appropriate to ice following activity with the compression sleeve in place.   Try the lyrica  Recheck in 6 weeks.

## 2020-06-21 NOTE — Progress Notes (Signed)
Left ankle x-ray shows no acute findings meaning there is no fractures.

## 2020-07-04 NOTE — Progress Notes (Deleted)
No-show to initial evaluation °

## 2020-07-05 ENCOUNTER — Ambulatory Visit: Payer: BC Managed Care – PPO | Admitting: Pain Medicine

## 2020-07-05 ENCOUNTER — Telehealth: Payer: Self-pay | Admitting: Family Medicine

## 2020-07-05 DIAGNOSIS — G894 Chronic pain syndrome: Secondary | ICD-10-CM | POA: Insufficient documentation

## 2020-07-05 DIAGNOSIS — M899 Disorder of bone, unspecified: Secondary | ICD-10-CM | POA: Insufficient documentation

## 2020-07-05 DIAGNOSIS — Z789 Other specified health status: Secondary | ICD-10-CM | POA: Insufficient documentation

## 2020-07-05 DIAGNOSIS — Z79899 Other long term (current) drug therapy: Secondary | ICD-10-CM | POA: Insufficient documentation

## 2020-07-05 NOTE — Telephone Encounter (Signed)
Patient called stating that she was scheduled with the Pain Management clinic in [place] this morning but she had a really hard time finding the building and they would not answer the phone to be able to help her. By the time she called she was too late for the visit. She asked if she could be referred to a different pain management office.  Please advise.  Patient said that we can message her through MyChart to let her know when the referral has been sent.

## 2020-07-06 NOTE — Telephone Encounter (Signed)
Please inquire if Ms. Carlton has an alternative location she would like to use.

## 2020-07-06 NOTE — Telephone Encounter (Signed)
Pt was sent a MyChart message inquiring which pain management office she would like to be referred to.
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
## 2020-07-30 ENCOUNTER — Other Ambulatory Visit: Payer: Self-pay

## 2020-07-30 ENCOUNTER — Ambulatory Visit: Payer: BC Managed Care – PPO | Admitting: Family Medicine

## 2020-07-30 ENCOUNTER — Ambulatory Visit: Payer: Self-pay

## 2020-07-30 VITALS — BP 118/76 | HR 66 | Ht 64.0 in | Wt 200.6 lb

## 2020-07-30 DIAGNOSIS — G8929 Other chronic pain: Secondary | ICD-10-CM | POA: Diagnosis not present

## 2020-07-30 DIAGNOSIS — M25572 Pain in left ankle and joints of left foot: Secondary | ICD-10-CM

## 2020-07-30 NOTE — Patient Instructions (Addendum)
Thank you for coming in today.  Contact pain management again.  Dr Luis Abed.  If this shot does not help next diagnostic step for me is 3 phase bone scan.  Give the shot a week and let me know.  I will order the bone scan if not better.    Complex Regional Pain Syndrome Complex regional pain syndrome (CRPS) is a nerve disorder that causes long-term (chronic) pain. This is usually in a hand, arm, foot, or leg. CRPS usually occurs after an injury or trauma, such as a fracture or sprain. There are two types of CRPS:  Type 1. This type occurs after an injury with no known damage to a nerve.  Type 2. This type occurs after an injury that damages a nerve. There are three stages of the condition:  Stage 1. This stage, called the acute stage, may last for up to 3 months.  Stage 2. This stage, called the dystrophic stage, may last for 3-12 months.  Stage 3. This stage, called the atrophic stage, may start after one year. CRPS ranges from mild to severe. For most people, CRPS is mild and recovery happens over time. For others, CRPS lasts for a very long time and makes everyday tasks hard to do. What are the causes? The exact cause of this condition is not known. It is usually triggered by an injury. What increases the risk? You are more likely to develop this condition if:  You are female.  You have any of the following injuries: ? A wrist fracture that involves a lower arm bone (distal radius fracture). ? Ankle dislocation or fracture. ? A long surgery time. ? Possible nerve injury during surgery. What are the signs or symptoms? Signs and symptoms in the affected hand, arm, foot, or leg are different for each stage. Signs and symptoms of stage 1 include:  Burning pain.  A prickling, tingling feeling (pins and needles sensation).  Extremely sensitive skin.  Swelling.  Joint stiffness.  Warmth and redness.  Excessive sweating.  Hair and nail growth that is faster than  normal. Signs and symptoms of stage 2 include:  Spreading of pain to the whole arm or leg.  Increased skin sensitivity.  Increased swelling and stiffness.  Coolness of the skin.  Blue discoloration of skin.  Loss of skin wrinkles.  Brittle fingernails. Signs and symptoms of stage 3 include:  Pain that spreads to other areas of the body but becomes less severe.  More stiffness, leading to loss of motion.  Skin that is pale, dry, shiny, or tightly stretched. How is this diagnosed? This condition may be diagnosed based on:  Your signs and symptoms.  A physical exam. There is no test to diagnose CRPS, but you may have tests:  To check for bone changes that might indicate CRPS. These tests may include an MRI or bone scan.  To rule out other possible causes of your symptoms. How is this treated? Early treatment may prevent CRPS from advancing past stage 1. There is not one treatment that works for everyone. Treatment options may include:  Medicines, which may include: ? NSAIDs, such as ibuprofen. ? Steroids. ? Blood pressure drugs. ? Antidepressants. ? Anti-seizure drugs. ? Pain relievers.  Exercise.  Occupational therapy (OT) and physical therapy (PT).  Biofeedback.  Mental health counseling.  Numbing injections.  Spinal surgery to implant a spinal cord stimulator or a pain pump.   Follow these instructions at home: Medicines  Take over-the-counter and prescription medicines only as  told by your health care provider.  Do not drive or use heavy machinery while taking prescription pain medicine.  If you are taking prescription pain medicine, take actions to prevent or treat constipation. Your health care provider may recommend that you: ? Drink enough fluid to keep your urine pale yellow. ? Eat foods that are high in fiber, such as fresh fruits and vegetables, whole grains, and beans. ? Limit foods that are high in fat and processed sugars, such as fried or  sweet foods. ? Take an over-the-counter or prescription medicine for constipation. General instructions  Do not use any products that contain nicotine or tobacco, such as cigarettes and e-cigarettes. If you need help quitting, ask your health care provider.  Maintain a healthy weight.  Return to your normal activities as told by your health care provider. Ask your health care provider what activities are safe for you.  Follow an exercise program as directed by your health care provider.  Keep all follow-up visits as told by your health care provider. This is important. Contact a health care provider if:  Your symptoms change.  Your symptoms get worse.  You develop anxiety or depression. Summary  Complex regional pain syndrome (CRPS) is a nerve disorder that causes long-term (chronic) pain, usually in a hand, arm, leg, or foot.  CRPS usually occurs after an injury or trauma, such as a fracture or sprain.  CRPS ranges from mild to severe. Early treatment may prevent CRPS from advancing to more severe stages. This information is not intended to replace advice given to you by your health care provider. Make sure you discuss any questions you have with your health care provider. Document Revised: 11/27/2019 Document Reviewed: 11/27/2019 Elsevier Patient Education  2021 [employer].

## 2021-08-23 IMAGING — DX DG ANKLE COMPLETE 3+V*L*
3 series · 3 of 3 positions shown · non-contrast
Comparison: None.

CLINICAL DATA: Lateral ankle pain for 6 months with no known
injury.

EXAM:
LEFT ANKLE COMPLETE - 3+ VIEW

[ankle ap]
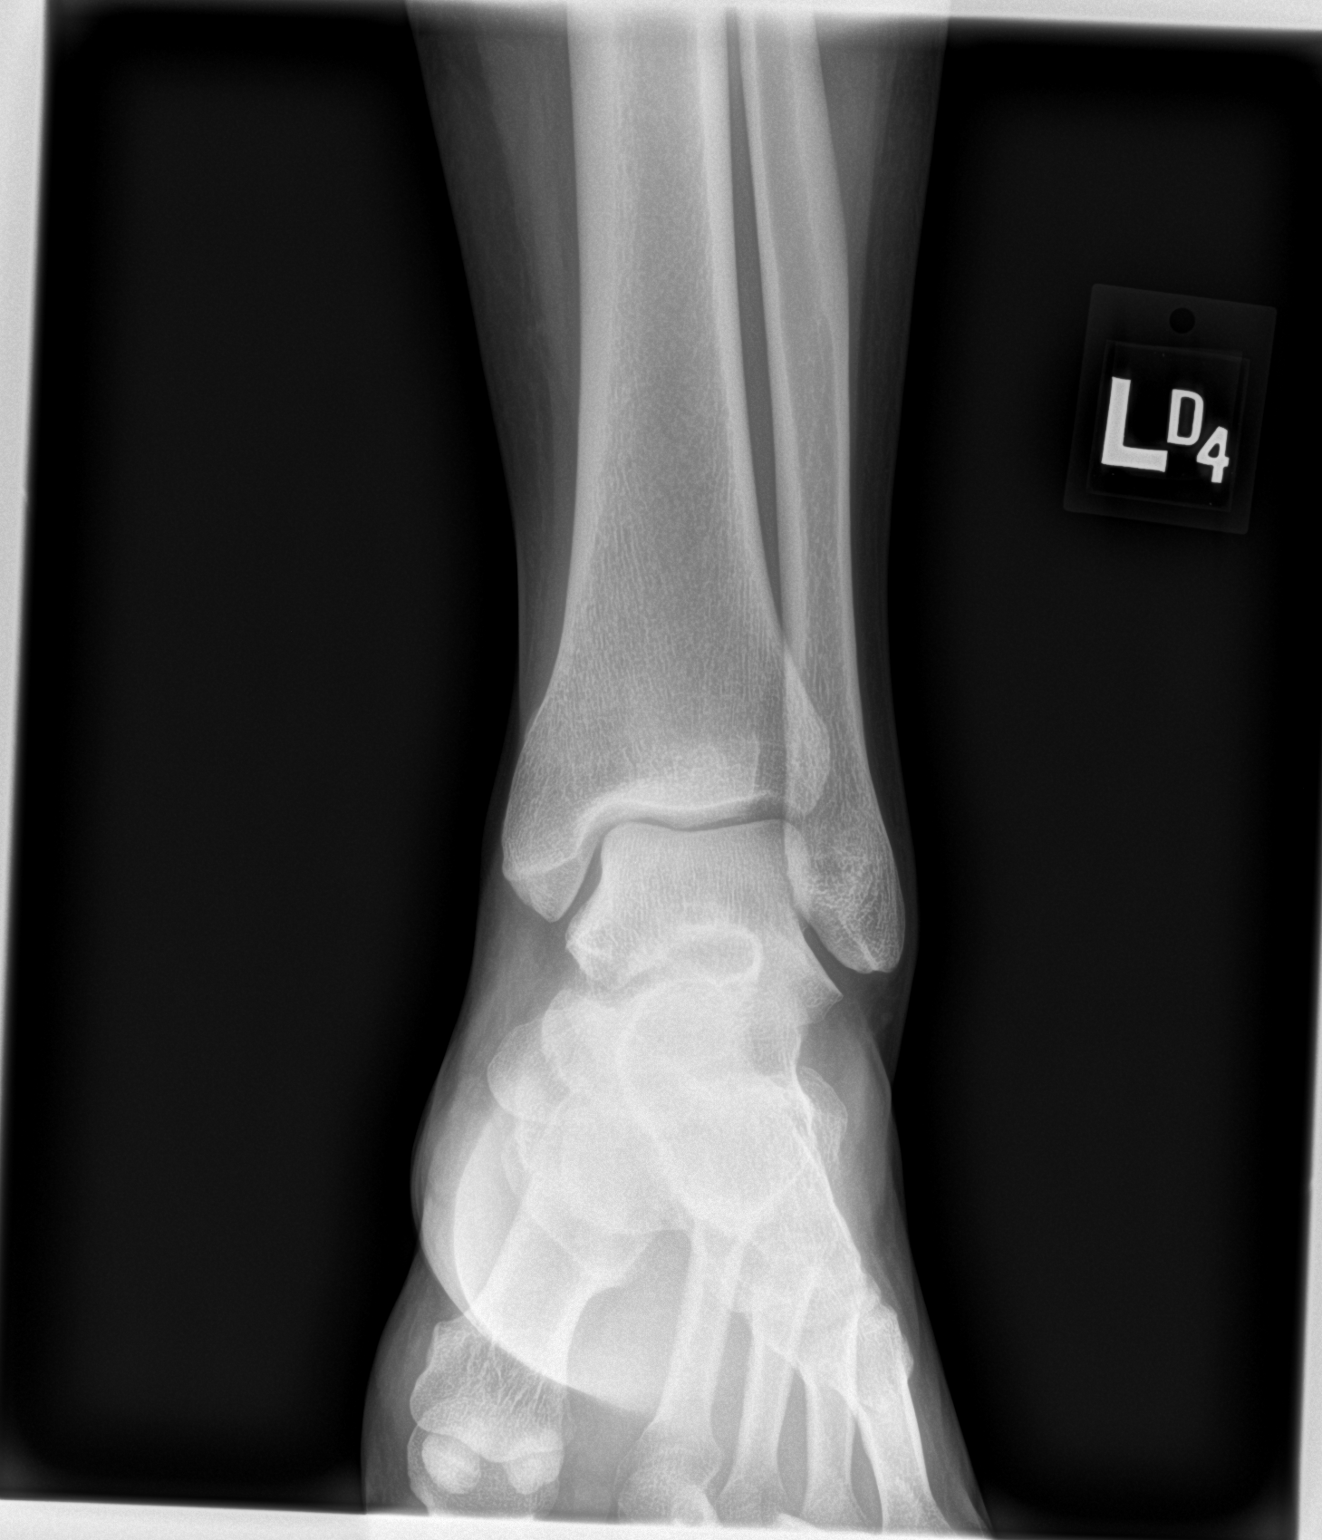

[ankle obl]
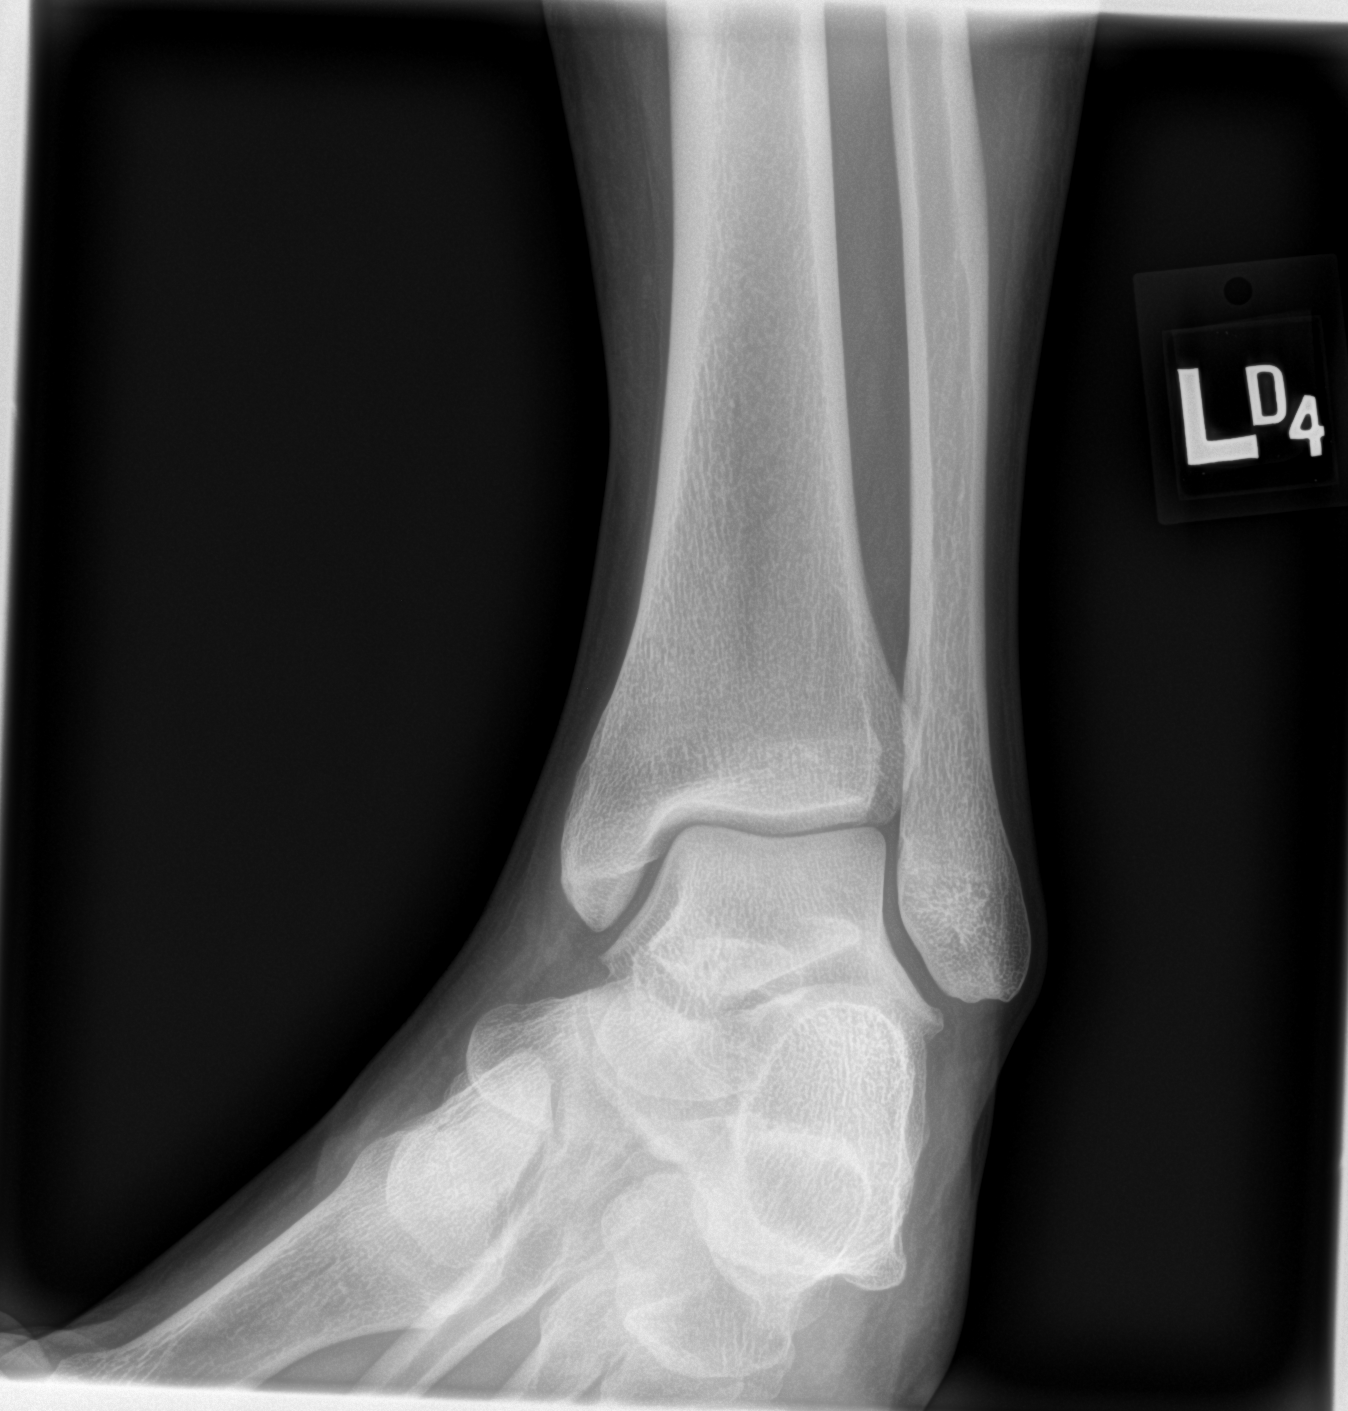

[ankle lat]
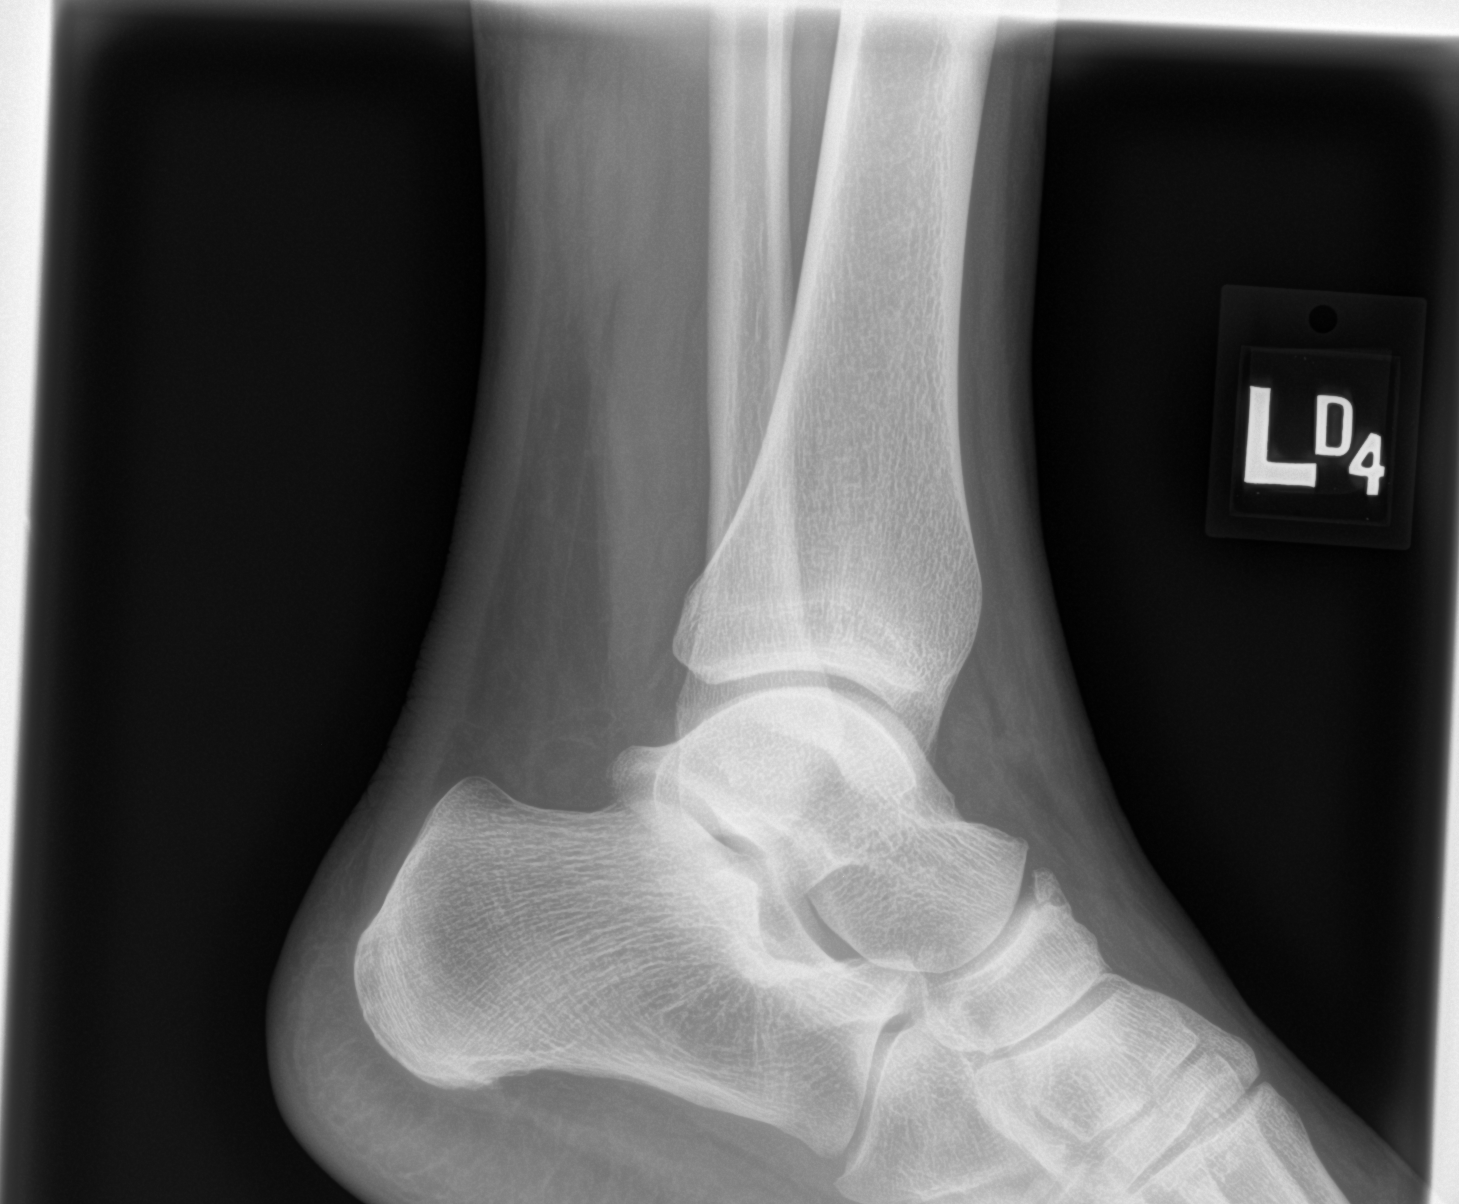

[3 of 3 positions shown; findings below may reference images not displayed]

FINDINGS: There is no evidence of fracture, dislocation, or joint effusion.
Mild spurring of the dorsal navicular joint. Soft tissues are
unremarkable.
IMPRESSION: No acute abnormality of the left ankle

## 2023-05-08 ENCOUNTER — Ambulatory Visit: Payer: Self-pay

## 2023-08-15 ENCOUNTER — Ambulatory Visit: Admitting: Dermatology

## 2023-08-15 ENCOUNTER — Encounter: Payer: Self-pay | Admitting: Dermatology

## 2023-08-15 VITALS — BP 126/84

## 2023-08-15 DIAGNOSIS — L309 Dermatitis, unspecified: Secondary | ICD-10-CM

## 2023-08-15 DIAGNOSIS — L700 Acne vulgaris: Secondary | ICD-10-CM

## 2023-08-15 DIAGNOSIS — L709 Acne, unspecified: Secondary | ICD-10-CM | POA: Diagnosis not present

## 2023-08-15 DIAGNOSIS — L304 Erythema intertrigo: Secondary | ICD-10-CM

## 2023-08-15 DIAGNOSIS — L209 Atopic dermatitis, unspecified: Secondary | ICD-10-CM

## 2023-08-15 MED ORDER — TRETINOIN 0.05 % EX CREA: TOPICAL_CREAM | CUTANEOUS | 0 refills | Status: DC

## 2023-08-15 MED ORDER — DOXYCYCLINE HYCLATE 50 MG PO CAPS: 50.0000 mg | ORAL_CAPSULE | Freq: Every day | ORAL | 3 refills | Status: DC

## 2023-08-15 MED ORDER — KETOCONAZOLE 2 % EX CREA: 1.0000 | TOPICAL_CREAM | Freq: Two times a day (BID) | CUTANEOUS | 5 refills | Status: AC

## 2023-08-15 MED ORDER — CLOBETASOL PROPIONATE 0.05 % EX CREA: 1.0000 | TOPICAL_CREAM | Freq: Two times a day (BID) | CUTANEOUS | 5 refills | Status: DC

## 2023-08-15 NOTE — Patient Instructions (Addendum)
 Date: Wed Aug 15 2023  Hello Tobin Forts,  Thank you for visiting today. Here is a summary of the key instructions:  - Stop using triamcinolone   For Eczema on Hands and future flares on body: - Use clobetasol twice a day for up to 2 weeks during severe eczema flares   - Take a 2-week break after each 2-week treatment period  For the Intertrigo under breast and groin: - Apply ketoconazole cream twice a day for irritation under the breast  - Use Zeasorb AF antifungal powder under the breast to prevent rash  For Acne on face and trunk - Take doxycycline 50 mg once a day with dinner for 4 months  - Use tretinoin cream for acne:   - Start with 2 nights a week   - Apply a pea-size amount mixed with moisturizer  - For chafing, use:   - Biker shorts   - Squirrel Butter   - Aquaphor Body Balm  - Message us  on MyChart if you have questions  If you have any questions or concerns, please do not hesitate to contact our office.  Warm regards,  Dr. Louana Roup [facility]         Important Information  Due to recent changes in healthcare laws, you may see results of your pathology and/or laboratory studies on MyChart before the doctors have had a chance to review them. We understand that in some cases there may be results that are confusing or concerning to you. Please understand that not all results are received at the same time and often the doctors may need to interpret multiple results in order to provide you with the best plan of care or course of treatment. Therefore, we ask that you please give us  2 business days to thoroughly review all your results before contacting the office for clarification. Should we see a critical lab result, you will be contacted sooner.   If You Need Anything After Your Visit  If you have any questions or concerns for your doctor, please call our main line at [phone] If no one answers, please leave a voicemail as directed and we will  return your call as soon as possible. Messages left after 4 pm will be answered the following business day.   You may also send us  a message via MyChart. We typically respond to MyChart messages within 1-2 business days.  For prescription refills, please ask your pharmacy to contact our office. Our fax number is [phone].  If you have an urgent issue when the clinic is closed that cannot wait until the next business day, you can page your doctor at the number below.    Please note that while we do our best to be available for urgent issues outside of office hours, we are not available 24/7.   If you have an urgent issue and are unable to reach us , you may choose to seek medical care at your doctor's office, retail clinic, urgent care center, or emergency room.  If you have a medical emergency, please immediately call 911 or go to the emergency department. In the event of inclement weather, please call our main line at [phone] for an update on the status of any delays or closures.  Dermatology Medication Tips: Please keep the boxes that topical medications come in in order to help keep track of the instructions about where and how to use these. Pharmacies typically print the medication instructions only on the boxes and not directly on the medication  tubes.   If your medication is too expensive, please contact our office at [phone] or send us  a message through MyChart.   We are unable to tell what your co-pay for medications will be in advance as this is different depending on your insurance coverage. However, we may be able to find a substitute medication at lower cost or fill out paperwork to get insurance to cover a needed medication.   If a prior authorization is required to get your medication covered by your insurance company, please allow us  1-2 business days to complete this process.  Drug prices often vary depending on where the prescription is filled and some pharmacies  may offer cheaper prices.  The website www.goodrx.com contains coupons for medications through different pharmacies. The prices here do not account for what the cost may be with help from insurance (it may be cheaper with your insurance), but the website can give you the price if you did not use any insurance.  - You can print the associated coupon and take it with your prescription to the pharmacy.  - You may also stop by our office during regular business hours and pick up a GoodRx coupon card.  - If you need your prescription sent electronically to a different pharmacy, notify our office through [facility] or by phone at [phone]

## 2023-08-15 NOTE — Progress Notes (Signed)
   New Patient Visit   Subjective  Anna Webb is a 38 y.o. female who presents for the following: New Pt - Acne  Patient states she has acne like rash that started Dec 2024 that is located on the neck, upper chest and upper back that she would like evaluated. She mentioned that she was Dx with eczema as a child and was first concerned that it was a flare but her PCP Dx her with acne and prescribed a course of Doxycycline 100mf that she was taking once daily for 2 months. Patient stated that affected areas had cleared while taking oral med. However, she has been out for 1 week and have started flaring again. She rates the itching a 9 out of 10. She is currently using her TMC for to help with the itching but it doesn't help much. Denied Hx of bx. Deniedfamily history of skin cancer(s).   The following portions of the chart were reviewed this encounter and updated as appropriate: medications, allergies, medical history  Review of Systems:  No other skin or systemic complaints except as noted in HPI or Assessment and Plan.  Objective  Well appearing patient in no apparent distress; mood and affect are within normal limits.   A focused examination was performed of the following areas: chest, back & neck   Relevant exam findings are noted in the Assessment and Plan.               Assessment & Plan   1. Eczema - Assessment: Long-standing history of eczema since childhood, with flares commonly affecting hands, arms, back of legs, and back of neck, particularly in winter. Patient has been using triamcinolone  daily for an extended period, which may have led to skin thinning and stretch marks, indicating potential steroid overuse.  - Plan:    Discontinue triamcinolone     Start clobetasol (potent steroid) twice daily for up to 2 weeks during severe flares, followed by a 2-week break    Educate on proper use of clobetasol and potential side effects of overuse    For chafing: Recommend use  of biker shorts or friction-reducing products (e.g., Squirrel Butter, Aquaphor Body Balm)  2. Intertrigo (suspected) - Assessment: Symptoms suggestive of intertrigo under the breast, likely due to sweating and friction.  - Plan:    Start ketoconazole cream twice daily for irritation    For prevention: Use Zeasorb AF antifungal powder  3. Acne - Assessment: History of acne that previously responded to doxycycline treatment.  - Plan:    Restart doxycycline 50 mg once daily with dinner for 4 months    Start tretinoin cream for pore unclogging:     - Apply a pea-sized amount mixed with moisturizer 2 nights per week initially    Educate on proper application of tretinoin   No follow-ups on file.    Documentation: I have reviewed the above documentation for accuracy and completeness, and I agree with the above.  I, Shirron Louanne Roussel, CMA, am acting as scribe for [employer], DO.   Louana Roup, DO

## 2023-09-21 ENCOUNTER — Encounter: Payer: Self-pay | Admitting: Dermatology

## 2023-09-21 DIAGNOSIS — L700 Acne vulgaris: Secondary | ICD-10-CM

## 2023-09-24 MED ORDER — TRETINOIN 0.05 % EX CREA
TOPICAL_CREAM | CUTANEOUS | 0 refills | Status: DC
Start: 2023-09-24 — End: 2024-01-07

## 2024-01-07 ENCOUNTER — Ambulatory Visit: Admitting: Dermatology

## 2024-01-07 ENCOUNTER — Encounter: Payer: Self-pay | Admitting: Dermatology

## 2024-01-07 VITALS — BP 117/79 | HR 80

## 2024-01-07 DIAGNOSIS — L700 Acne vulgaris: Secondary | ICD-10-CM | POA: Diagnosis not present

## 2024-01-07 DIAGNOSIS — L906 Striae atrophicae: Secondary | ICD-10-CM

## 2024-01-07 DIAGNOSIS — L209 Atopic dermatitis, unspecified: Secondary | ICD-10-CM

## 2024-01-07 MED ORDER — RETIN-A 0.025 % EX CREA: TOPICAL_CREAM | Freq: Every evening | CUTANEOUS | 3 refills | Status: AC

## 2024-01-07 MED ORDER — CLOBETASOL PROPIONATE 0.05 % EX OINT: 1.0000 | TOPICAL_OINTMENT | Freq: Two times a day (BID) | CUTANEOUS | 6 refills | Status: AC

## 2024-01-07 MED ORDER — ELIDEL 1 % EX CREA: TOPICAL_CREAM | Freq: Two times a day (BID) | CUTANEOUS | 9 refills | Status: AC

## 2024-01-07 NOTE — Patient Instructions (Addendum)
 VISIT SUMMARY:  Today, you were seen for the management of your persistent facial acne and eczema. We discussed your ongoing issues with facial acne despite treatment with oral doxycycline , and the recent flare-ups of your eczema. We also addressed the development of stretch marks due to prolonged use of triamcinolone  cream.  YOUR PLAN:  -ACNE VULGARIS:  Acne vulgaris is a common skin condition that occurs when hair follicles become clogged with oil and dead skin cells. Continue Doxy 50mg  daily.  To manage your persistent facial acne, continue using Retin A 0.025% three nights a week (Monday, Wednesday, Friday). If your skin becomes tight, dry, stinging, or burning, reduce the application to two nights a week. You were provided with samples of Avene Cicalfate nighttime moisturizer, Avene gel cleanser, and CeraVe cream to foam cleanser to help prevent irritation. Continue taking your oral doxycycline  as prescribed.  -ATOPIC DERMATITIS:  Atopic dermatitis, also known as eczema, is a condition that makes your skin red and itchy. For your dry patches, use clobetasol  for up to two weeks in the same area, then take a break. To prevent recurrence of dry patches, use Aveeno eczema itch balm at night.  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 To manage this, use pimecrolimus cream to calm inflammation and itching. You were also provided with samples of CeraVe anti-itch lotion with pramoxine. Use Avene retinol every other night to prepare your skin for future tretinoin  treatment, and Avene Cicalfate to calm irritation if needed.  INSTRUCTIONS:  Please follow up as needed if your symptoms do not improve or if you experience any adverse reactions to the treatments. Continue with your current skincare routine and try the new products provided to see if they help manage your skin conditions  better.     Important Information   Due to recent changes in healthcare laws, you may see results of your pathology and/or laboratory studies on MyChart before the doctors have had a chance to review them. We understand that in some cases there may be results that are confusing or concerning to you. Please understand that not all results are received at the same time and often the doctors may need to interpret multiple results in order to provide you with the best plan of care or course of treatment. Therefore, we ask that you please give us  2 business days to thoroughly review all your results before contacting the office for clarification. Should we see a critical lab result, you will be contacted sooner.     If You Need Anything After Your Visit   If you have any questions or concerns for your doctor, please call our main line at [phone]. If no one answers, please leave a voicemail as directed and we will return your call as soon as possible. Messages left after 4 pm will be answered the following business day.    You may also send us  a message via MyChart. We typically respond to MyChart messages within 1-2 business days.  For prescription refills, please ask your pharmacy to contact our office. Our fax number is [phone].  If you have an urgent issue when the clinic is closed that cannot wait until the next business day, you can page your doctor at the number below.     Please note that while we do our best to be available for urgent issues outside of office hours, we are  not available 24/7.    If you have an urgent issue and are unable to reach us , you may choose to seek medical care at your doctor's office, retail clinic, urgent care center, or emergency room.   If you have a medical emergency, please immediately call 911 or go to the emergency department. In the event of inclement weather, please call our main line at [phone] for an update on the status of any delays or  closures.  Dermatology Medication Tips: Please keep the boxes that topical medications come in in order to help keep track of the instructions about where and how to use these. Pharmacies typically print the medication instructions only on the boxes and not directly on the medication tubes.   If your medication is too expensive, please contact our office at [phone] or send us  a message through MyChart.    We are unable to tell what your co-pay for medications will be in advance as this is different depending on your insurance coverage. However, we may be able to find a substitute medication at lower cost or fill out paperwork to get insurance to cover a needed medication.    If a prior authorization is required to get your medication covered by your insurance company, please allow us  1-2 business days to complete this process.   Drug prices often vary depending on where the prescription is filled and some pharmacies may offer cheaper prices.   The website www.goodrx.com contains coupons for medications through different pharmacies. The prices here do not account for what the cost may be with help from insurance (it may be cheaper with your insurance), but the website can give you the price if you did not use any insurance.  - You can print the associated coupon and take it with your prescription to the pharmacy.  - You may also stop by our office during regular business hours and pick up a GoodRx coupon card.  - If you need your prescription sent electronically to a different pharmacy, notify our office through [facility] or by phone at [phone]

## 2024-01-07 NOTE — Progress Notes (Unsigned)
 Follow-Up Visit   Subjective  Anna Webb is a 38 y.o. female who presents for the following: Acne  Patient present today for follow up visit for Acne. Patient was last evaluated on 08/15/23. At this visit patient was prescribed Doxycycline  50 mg to take daily with Dinner, Tretinoin  0.05% cream to apply M, W & Fr. She reports she wasn't able to purchase the Tretinoin  due to confusion with Insurance. Patient reports sxs are unchanged. Patient denies medication changes.  Patient was also evaluated for eczema. She was prescribed Clobetasol  which has worked very well for her. She is   The following portions of the chart were reviewed this encounter and updated as appropriate: medications, allergies, medical history  Review of Systems:  No other skin or systemic complaints except as noted in HPI or Assessment and Plan.  Objective  Well appearing patient in no apparent distress; mood and affect are within normal limits.  A focused examination was performed of the following areas: Face and Back  Relevant exam findings are noted in the Assessment and Plan.                  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  It is exacerbated by increased movement / activity; sweating; and higher atmospheric temperature.  Use of an absorbant powder such as Zeasorb AF powder or other OTC antifungal powder to the area daily can prevent rash recurrence. Other options to help  keep the area dry include blow drying the area after bathing or using antiperspirant products such as Duradry sweat minimizing gel.  Treatment Plan: - Prescribed Elidel mixed with CeraVe Anti itch or Aveeno balm to apply to the site 2 times daily  ATOPIC DERMATITIS Exam: Scaly pink papules coalescing to plaques ***% BSA  Flared  Atopic dermatitis (eczema) is a chronic, relapsing, pruritic condition that can significantly affect quality of life. It is often associated with allergic rhinitis and/or asthma and can require treatment with topical medications, phototherapy, or in severe cases biologic injectable medication (Dupixent; Adbry) or Oral JAK inhibitors.  Treatment Plan: - Recommend gentle skin care - Refills sent for Clobetasol  Ointment to apply 2 times daily for 2 weeks then STOP  ACNE VULGARIS   Related Medications doxycycline  (VIBRAMYCIN ) 50 MG capsule Take 1 capsule (50 mg total) by mouth daily. Take for 4 months then stop. RETIN-A  0.025 % cream Apply topically at bedtime. Apply topically at bedtime. Apply 1 night weekly for the first month, Apply 2 nights weekly for 1 month, If your skin tolerates increase to 3 nights weekly starting at 3 months. Decrease usage if you experience excessive dryness to 1-2 nights ATOPIC DERMATITIS, UNSPECIFIED TYPE   Related Medications clobetasol  ointment (TEMOVATE ) 0.05 % Apply 1 Application topically 2 (two) times daily. Apply 2 times daily for 2 weeks then STOP for 2 weeks, resume 2 weeks on and 2 weeks off ELIDEL 1 % cream Apply topically 2 (two) times daily. Apply to both Armpits, underneath breast, groin and inner thighs 2 times  daily  Return in about 7 months (around 07/24/2024) for Acne, Eczema and Striae F/U.  I, Jetta Ager, am acting as neurosurgeon for [employer], DO.  Documentation: I have reviewed the above documentation for accuracy and completeness, and I agree with the above.  Delon Lenis, DO

## 2024-03-27 ENCOUNTER — Other Ambulatory Visit: Payer: Self-pay | Admitting: Dermatology

## 2024-03-27 DIAGNOSIS — L700 Acne vulgaris: Secondary | ICD-10-CM

## 2024-07-23 ENCOUNTER — Ambulatory Visit: Admitting: Dermatology
